# Patient Record
Sex: Female | Born: 1975 | Race: White | Hispanic: No | Marital: Married | Smoking: Former smoker
Health system: Southern US, Community
[De-identification: ages and names within clinical notes are randomized; demographics above are authoritative.]

## PROBLEM LIST (undated history)

## (undated) DIAGNOSIS — J45909 Unspecified asthma, uncomplicated: Secondary | ICD-10-CM

## (undated) DIAGNOSIS — Q250 Patent ductus arteriosus: Secondary | ICD-10-CM

## (undated) DIAGNOSIS — A048 Other specified bacterial intestinal infections: Secondary | ICD-10-CM

## (undated) HISTORY — PX: VEIN LIGATION: SHX2652

---
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
## 2003-12-16 DIAGNOSIS — A048 Other specified bacterial intestinal infections: Secondary | ICD-10-CM

## 2003-12-16 HISTORY — DX: Other specified bacterial intestinal infections: A04.8

## 2011-12-16 HISTORY — PX: ABDOMINAL HYSTERECTOMY: SHX81

## 2015-01-20 ENCOUNTER — Emergency Department (HOSPITAL_COMMUNITY)
Admission: EM | Admit: 2015-01-20 | Discharge: 2015-01-20 | Disposition: A | Discharge status: Home / Self Care | Payer: Self-pay | Attending: Emergency Medicine | Admitting: Emergency Medicine

## 2015-01-20 ENCOUNTER — Emergency Department (HOSPITAL_COMMUNITY): Payer: Self-pay

## 2015-01-20 ENCOUNTER — Encounter (HOSPITAL_COMMUNITY): Payer: Self-pay | Admitting: Emergency Medicine

## 2015-01-20 DIAGNOSIS — R300 Dysuria: Secondary | ICD-10-CM | POA: Insufficient documentation

## 2015-01-20 DIAGNOSIS — Z3202 Encounter for pregnancy test, result negative: Secondary | ICD-10-CM | POA: Insufficient documentation

## 2015-01-20 DIAGNOSIS — Z79899 Other long term (current) drug therapy: Secondary | ICD-10-CM | POA: Insufficient documentation

## 2015-01-20 DIAGNOSIS — Z8619 Personal history of other infectious and parasitic diseases: Secondary | ICD-10-CM | POA: Insufficient documentation

## 2015-01-20 DIAGNOSIS — J45909 Unspecified asthma, uncomplicated: Secondary | ICD-10-CM | POA: Insufficient documentation

## 2015-01-20 DIAGNOSIS — Z791 Long term (current) use of non-steroidal anti-inflammatories (NSAID): Secondary | ICD-10-CM | POA: Insufficient documentation

## 2015-01-20 DIAGNOSIS — R102 Pelvic and perineal pain: Secondary | ICD-10-CM

## 2015-01-20 HISTORY — DX: Patent ductus arteriosus: Q25.0

## 2015-01-20 HISTORY — DX: Other specified bacterial intestinal infections: A04.8

## 2015-01-20 HISTORY — DX: Unspecified asthma, uncomplicated: J45.909

## 2015-01-20 LAB — COMPREHENSIVE METABOLIC PANEL WITH GFR
ALT: 10 U/L (ref 0–35)
AST: 22 U/L (ref 0–37)
Albumin: 4.9 g/dL (ref 3.5–5.2)
Alkaline Phosphatase: 78 U/L (ref 39–117)
Anion gap: 9 (ref 5–15)
BUN: 14 mg/dL (ref 6–23)
CO2: 25 mmol/L (ref 19–32)
Calcium: 9.3 mg/dL (ref 8.4–10.5)
Chloride: 105 mmol/L (ref 96–112)
Creatinine, Ser: 0.58 mg/dL (ref 0.50–1.10)
GFR calc Af Amer: >90 mL/min (ref >90)
GFR calc non Af Amer: >90 mL/min (ref >90)
Glucose, Bld: 87 mg/dL (ref 70–99)
Potassium: 3.9 mmol/L (ref 3.5–5.1)
Sodium: 139 mmol/L (ref 135–145)
Total Bilirubin: 0.6 mg/dL (ref 0.3–1.2)
Total Protein: 7.9 g/dL (ref 6.0–8.3)

## 2015-01-20 LAB — URINALYSIS, ROUTINE W REFLEX MICROSCOPIC
Bilirubin Urine: NEGATIVE
Glucose, UA: NEGATIVE mg/dL
Hgb urine dipstick: NEGATIVE
Ketones, ur: NEGATIVE mg/dL
Leukocytes, UA: NEGATIVE
Nitrite: NEGATIVE
Protein, ur: NEGATIVE mg/dL
Specific Gravity, Urine: 1.007 (ref 1.005–1.030)
Urobilinogen, UA: 0.2 mg/dL (ref 0.0–1.0)
pH: 7 (ref 5.0–8.0)

## 2015-01-20 LAB — CBC WITH DIFFERENTIAL/PLATELET
Basophils Absolute: 0.1 10*3/uL (ref 0.0–0.1)
Basophils Relative: 1 % (ref 0–1)
Eosinophils Absolute: 0.4 10*3/uL (ref 0.0–0.7)
Eosinophils Relative: 5 % (ref 0–5)
HCT: 43.2 % (ref 36.0–46.0)
Hemoglobin: 14.5 g/dL (ref 12.0–15.0)
Lymphocytes Relative: 42 % (ref 12–46)
Lymphs Abs: 3.1 10*3/uL (ref 0.7–4.0)
MCH: 31.1 pg (ref 26.0–34.0)
MCHC: 33.6 g/dL (ref 30.0–36.0)
MCV: 92.7 fL (ref 78.0–100.0)
Monocytes Absolute: 0.6 10*3/uL (ref 0.1–1.0)
Monocytes Relative: 9 % (ref 3–12)
Neutro Abs: 3.1 10*3/uL (ref 1.7–7.7)
Neutrophils Relative %: 43 % (ref 43–77)
Platelets: 255 10*3/uL (ref 150–400)
RBC: 4.66 MIL/uL (ref 3.87–5.11)
RDW: 13.2 % (ref 11.5–15.5)
WBC: 7.3 10*3/uL (ref 4.0–10.5)

## 2015-01-20 LAB — WET PREP, GENITAL
Clue Cells Wet Prep HPF POC: NONE SEEN
Trich, Wet Prep: NONE SEEN
WBC, Wet Prep HPF POC: NONE SEEN
Yeast Wet Prep HPF POC: NONE SEEN

## 2015-01-20 LAB — LIPASE, BLOOD: Lipase: 27 U/L (ref 11–59)

## 2015-01-20 MED ORDER — ONDANSETRON HCL 4 MG/2ML IJ SOLN
4.0000 mg | Freq: Once | INTRAMUSCULAR | Status: AC
  Administered 2015-01-20: 4 mg via INTRAVENOUS
  Filled 2015-01-20: qty 2

## 2015-01-20 MED ORDER — NAPROXEN 500 MG PO TABS: 500.0000 mg | ORAL_TABLET | Freq: Two times a day (BID) | ORAL | Status: DC

## 2015-01-20 MED ORDER — HYDROMORPHONE HCL 1 MG/ML IJ SOLN
1.0000 mg | Freq: Once | INTRAMUSCULAR | Status: AC
  Administered 2015-01-20: 1 mg via INTRAVENOUS
  Filled 2015-01-20: qty 1

## 2015-01-20 MED ORDER — SODIUM CHLORIDE 0.9 % IV BOLUS (SEPSIS): 1000.0000 mL | Freq: Once | INTRAVENOUS | Status: DC

## 2015-01-20 MED ORDER — KETOROLAC TROMETHAMINE 30 MG/ML IJ SOLN: 30.0000 mg | Freq: Once | INTRAMUSCULAR | Status: DC

## 2015-01-20 MED ORDER — PHENAZOPYRIDINE HCL 200 MG PO TABS: 200.0000 mg | ORAL_TABLET | Freq: Three times a day (TID) | ORAL | Status: DC

## 2015-01-20 MED ORDER — SODIUM CHLORIDE 0.9 % IV SOLN
1000.0000 mL | Freq: Once | INTRAVENOUS | Status: AC
  Administered 2015-01-20: 1000 mL via INTRAVENOUS

## 2015-01-20 NOTE — ED Notes (Signed)
Patient has a history of having bladder problems. Patient is complaining of abdominal pain that started today at work and has continuously became worse.

## 2015-01-20 NOTE — ED Provider Notes (Signed)
CSN: [id number]     Arrival date & time 01/20/15  2015 History   First MD Initiated Contact with Patient 01/20/15 2043     Chief Complaint  Patient presents with  . Abdominal Pain     (Consider location/radiation/quality/duration/timing/severity/associated sxs/prior Treatment) HPI Pt is a 39yo female with hx of "bladder problems" and endometriosis, presenting to ED with c/o gradually worsening suprapubic and RLQ abdominal pain since yesterday with associated dysuria.  Abdominal pain is constant, aching, cramping and throbbing, 10/10 at worst, 6/10 at this time.  Pt reports urinary frequency with pressure, and dysuria. States she was having multiple recurrent UTIs and had to have several bladder procedures including stretching of her urethra due to scar tissue after a foley was placed while pt was pregnant at 39yrs old. Pt is followed by urology at [facility] in [facility]. States she has not bladder issues for 2 years but thinks she is needs to have her urethra stretched again.  Denies taking any pain medication as she states she does not like taking medication including antibiotics as she always gets a yeast infection but does state she was on macrobid for previous UTIs which seemed to help.  Pt also states she has had to have  dilaudid to help with her pelvic pain in the past. No pain medication PTA.  Past Medical History  Diagnosis Date  . PDA (patent ductus arteriosus) 1985    left ventrical repair  . Asthma   . H. pylori infection 2005   Past Surgical History  Procedure Laterality Date  . Abdominal hysterectomy  2013    Right ovary still there but every thing else was taken out  . Vein ligation     No family history on file. History  Substance Use Topics  . Smoking status: Former Smoker    Quit date: 12/20/2014  . Smokeless tobacco: Never Used  . Alcohol Use: No   OB History    No data available     Review of Systems  Constitutional: Negative for fever and chills.    Gastrointestinal: Positive for nausea and abdominal pain. Negative for vomiting, diarrhea and constipation.  Genitourinary: Positive for dysuria, urgency, frequency, flank pain and pelvic pain ( right side and suprapubic). Negative for hematuria, vaginal bleeding, vaginal discharge and vaginal pain.  Musculoskeletal: Negative for myalgias and back pain.  All other systems reviewed and are negative.     Allergies  Review of patient's allergies indicates no known allergies.  Home Medications   Prior to Admission medications   Medication Sig Start Date End Date Taking? Authorizing Provider  naproxen (NAPROSYN) 500 MG tablet Take 1 tablet (500 mg total) by mouth 2 (two) times daily. 01/20/15   Junius Finner, PA-C  phenazopyridine (PYRIDIUM) 200 MG tablet Take 1 tablet (200 mg total) by mouth 3 (three) times daily. 01/20/15   Junius Finner, PA-C   BP 123/77 mmHg  Pulse 79  Temp(Src) 98.3 F (36.8 C) (Oral)  Resp 18  Ht  (1.702 m)  Wt 137 lb (62.143 kg)  BMI 21.45 kg/m2  SpO2 98% Physical Exam  Constitutional: She appears well-developed and well-nourished. No distress.  Pt lying in exam bed, NAD. Non-toxic appearing.  HENT:  Head: Normocephalic and atraumatic.  Eyes: Conjunctivae are normal. No scleral icterus.  Neck: Normal range of motion.  Cardiovascular: Normal rate, regular rhythm and normal heart sounds.   Pulmonary/Chest: Effort normal and breath sounds normal. No respiratory distress. She has no wheezes. She has no  rales. She exhibits no tenderness.  Abdominal: Soft. Bowel sounds are normal. She exhibits no distension and no mass. There is tenderness in the right lower quadrant and suprapubic area. There is no rebound, no guarding and no CVA tenderness.    Soft, non-distended. Tenderness to RLQ and suprapubic region w/o rebound or guarding. No masses palpated. No CVAT  Genitourinary:  Chaperoned exam. Normal external genitalia. Vaginal canal-small amount of physiologic  discharge. No vaginal bleeding. No CMT. Right side adnexal tenderness w/o mass palpated.  No left adnexal tenderness or mass. Tenderness over bladder w/o palpable mass.  Musculoskeletal: Normal range of motion.  Neurological: She is alert.  Skin: Skin is warm and dry. She is not diaphoretic.  Nursing note and vitals reviewed.   ED Course  Procedures (including critical care time) Labs Review Labs Reviewed  WET PREP, GENITAL  URINALYSIS, ROUTINE W REFLEX MICROSCOPIC  CBC WITH DIFFERENTIAL/PLATELET  COMPREHENSIVE METABOLIC PANEL  LIPASE, BLOOD  RPR  POC URINE PREG, ED  GC/CHLAMYDIA PROBE AMP ([place])    Imaging Review US Transvaginal Non-ob  01/20/2015   CLINICAL DATA:  Right adnexal pain.  EXAM: TRANSABDOMINAL AND TRANSVAGINAL ULTRASOUND OF PELVIS  TECHNIQUE: Both transabdominal and transvaginal ultrasound examinations of the pelvis were performed. Transabdominal technique was performed for global imaging of the pelvis including uterus, ovaries, adnexal regions, and pelvic cul-de-sac. It was necessary to proceed with endovaginal exam following the transabdominal exam to visualize the ovaries.  COMPARISON:  None  FINDINGS: Uterus  Surgically absent.  Endometrium  N/A  Right ovary  Measurements: 3.9 x 2.1 x 2.1 cm. Normal appearance/no adnexal mass.  Left ovary  Surgically absent.  No adnexal mass.  Other findings  No free fluid.  The bladder appears normal.  IMPRESSION: Status post hysterectomy and left oophorectomy.  No adnexal mass.  Normal right ovary.   Electronically Signed   By: Loralie Champagne M.D.   On: 01/20/2015 22:50   US Pelvis Complete  01/20/2015   CLINICAL DATA:  Right adnexal pain.  EXAM: TRANSABDOMINAL AND TRANSVAGINAL ULTRASOUND OF PELVIS  TECHNIQUE: Both transabdominal and transvaginal ultrasound examinations of the pelvis were performed. Transabdominal technique was performed for global imaging of the pelvis including uterus, ovaries, adnexal regions, and pelvic  cul-de-sac. It was necessary to proceed with endovaginal exam following the transabdominal exam to visualize the ovaries.  COMPARISON:  None  FINDINGS: Uterus  Surgically absent.  Endometrium  N/A  Right ovary  Measurements: 3.9 x 2.1 x 2.1 cm. Normal appearance/no adnexal mass.  Left ovary  Surgically absent.  No adnexal mass.  Other findings  No free fluid.  The bladder appears normal.  IMPRESSION: Status post hysterectomy and left oophorectomy.  No adnexal mass.  Normal right ovary.   Electronically Signed   By: Loralie Champagne M.D.   On: 01/20/2015 22:50     EKG Interpretation None      MDM   Final diagnoses:  Pelvic pain in female  Dysuria    Pt is a 39yo female with reports of recurrent UTIs due to "bladder issues" reports hx of having her urethra stretched, followed by [facility] in [facility]. Pt appears uncomfortable but non-toxic. Pt is afebrile. Tenderness in RLQ and suprapubic region. Right adnexal tenderness w/o mass.  Labs: WNL. No evidence of UTI or other underlying infection.  Pelvic U/S: normal exam.  Not concerned for TOA, ovarian torsion, or ectopic pregnancy.  Doubt appendicitis.  Pain improved after  IV dilaudid and IV fluids.  Re-exam of abdomen: soft, non-distended, non-tender.  Not concerned for surgical abdomen. Pt hemodynamically stable, will discharge home with symptomatic tx with pyridium and naproxen. Advised to f/u with urology for recheck of symptoms. Return precautions provided. Pt verbalized understanding and agreement with tx plan.     Junius Finnerrin O'Malley, PA-C 01/20/15 2358  Gwyneth SproutWhitney Plunkett, MD 01/21/15 [phone]

## 2015-01-22 LAB — GC/CHLAMYDIA PROBE AMP (~~LOC~~) NOT AT ARMC
Chlamydia: NEGATIVE
Neisseria Gonorrhea: NEGATIVE

## 2015-01-22 LAB — RPR: RPR Ser Ql: NONREACTIVE

## 2015-10-19 ENCOUNTER — Encounter (HOSPITAL_COMMUNITY): Payer: Self-pay | Admitting: Emergency Medicine

## 2015-10-19 ENCOUNTER — Emergency Department (HOSPITAL_COMMUNITY)
Admission: EM | Admit: 2015-10-19 | Discharge: 2015-10-19 | Disposition: A | Discharge status: Home / Self Care | Payer: Self-pay | Attending: Emergency Medicine | Admitting: Emergency Medicine

## 2015-10-19 DIAGNOSIS — Q250 Patent ductus arteriosus: Secondary | ICD-10-CM | POA: Insufficient documentation

## 2015-10-19 DIAGNOSIS — J45909 Unspecified asthma, uncomplicated: Secondary | ICD-10-CM | POA: Insufficient documentation

## 2015-10-19 DIAGNOSIS — H8111 Benign paroxysmal vertigo, right ear: Secondary | ICD-10-CM | POA: Insufficient documentation

## 2015-10-19 DIAGNOSIS — Z87891 Personal history of nicotine dependence: Secondary | ICD-10-CM | POA: Insufficient documentation

## 2015-10-19 DIAGNOSIS — Z79899 Other long term (current) drug therapy: Secondary | ICD-10-CM | POA: Insufficient documentation

## 2015-10-19 DIAGNOSIS — G43809 Other migraine, not intractable, without status migrainosus: Secondary | ICD-10-CM | POA: Insufficient documentation

## 2015-10-19 DIAGNOSIS — Z8619 Personal history of other infectious and parasitic diseases: Secondary | ICD-10-CM | POA: Insufficient documentation

## 2015-10-19 LAB — BASIC METABOLIC PANEL
Anion gap: 7 (ref 5–15)
BUN: 16 mg/dL (ref 6–20)
CO2: 27 mmol/L (ref 22–32)
Calcium: 9.4 mg/dL (ref 8.9–10.3)
Chloride: 104 mmol/L (ref 101–111)
Creatinine, Ser: 0.68 mg/dL (ref 0.44–1.00)
GFR calc Af Amer: >60 mL/min (ref >60)
GFR calc non Af Amer: >60 mL/min (ref >60)
Glucose, Bld: 98 mg/dL (ref 65–99)
Potassium: 3.9 mmol/L (ref 3.5–5.1)
Sodium: 138 mmol/L (ref 135–145)

## 2015-10-19 LAB — URINALYSIS, ROUTINE W REFLEX MICROSCOPIC
Bilirubin Urine: NEGATIVE
Glucose, UA: NEGATIVE mg/dL
Hgb urine dipstick: NEGATIVE
Ketones, ur: NEGATIVE mg/dL
Leukocytes, UA: NEGATIVE
Nitrite: NEGATIVE
Protein, ur: NEGATIVE mg/dL
Specific Gravity, Urine: 1.014 (ref 1.005–1.030)
Urobilinogen, UA: 1 mg/dL (ref 0.0–1.0)
pH: 7 (ref 5.0–8.0)

## 2015-10-19 LAB — CBC
HCT: 38.9 % (ref 36.0–46.0)
Hemoglobin: 12.9 g/dL (ref 12.0–15.0)
MCH: 30.7 pg (ref 26.0–34.0)
MCHC: 33.2 g/dL (ref 30.0–36.0)
MCV: 92.6 fL (ref 78.0–100.0)
Platelets: 242 10*3/uL (ref 150–400)
RBC: 4.2 MIL/uL (ref 3.87–5.11)
RDW: 13.1 % (ref 11.5–15.5)
WBC: 7.1 10*3/uL (ref 4.0–10.5)

## 2015-10-19 MED ORDER — SODIUM CHLORIDE 0.9 % IV BOLUS (SEPSIS)
1000.0000 mL | Freq: Once | INTRAVENOUS | Status: AC
  Administered 2015-10-19: 1000 mL via INTRAVENOUS

## 2015-10-19 MED ORDER — MECLIZINE HCL 25 MG PO TABS: 25.0000 mg | ORAL_TABLET | Freq: Three times a day (TID) | ORAL | Status: DC | PRN

## 2015-10-19 MED ORDER — DEXAMETHASONE SODIUM PHOSPHATE 10 MG/ML IJ SOLN
10.0000 mg | Freq: Once | INTRAMUSCULAR | Status: AC
  Administered 2015-10-19: 10 mg via INTRAVENOUS
  Filled 2015-10-19: qty 1

## 2015-10-19 MED ORDER — METOCLOPRAMIDE HCL 5 MG/ML IJ SOLN
10.0000 mg | Freq: Once | INTRAMUSCULAR | Status: AC
  Administered 2015-10-19: 10 mg via INTRAVENOUS
  Filled 2015-10-19: qty 2

## 2015-10-19 MED ORDER — DIPHENHYDRAMINE HCL 50 MG/ML IJ SOLN
25.0000 mg | Freq: Once | INTRAMUSCULAR | Status: AC
  Administered 2015-10-19: 25 mg via INTRAVENOUS
  Filled 2015-10-19: qty 1

## 2015-10-19 NOTE — ED Provider Notes (Signed)
CSN: [id number]     Arrival date & time 10/19/15  1712 History   First MD Initiated Contact with Patient 10/19/15 1804     Chief Complaint  Patient presents with  . low iron      (Consider location/radiation/quality/duration/timing/severity/associated sxs/prior Treatment) Patient is a 39 y.o. female presenting with dizziness. The history is provided by the patient.  Dizziness Quality:  Lightheadedness and head spinning Severity:  Moderate Onset quality:  Gradual Duration:  2 weeks Timing:  Constant Progression:  Waxing and waning Chronicity:  New Context: not with loss of consciousness   Relieved by:  Nothing Worsened by:  Nothing Ineffective treatments:  None tried Associated symptoms: headaches (dull back of head)   Associated symptoms: no chest pain, no diarrhea, no shortness of breath, no syncope and no vomiting   Risk factors: no hx of vertigo and no new medications     Past Medical History  Diagnosis Date  . PDA (patent ductus arteriosus) 1985    left ventrical repair  . Asthma   . H. pylori infection 2005   Past Surgical History  Procedure Laterality Date  . Abdominal hysterectomy  2013    Right ovary still there but every thing else was taken out  . Vein ligation     No family history on file. Social History  Substance Use Topics  . Smoking status: Former Smoker    Quit date: 12/20/2014  . Smokeless tobacco: Never Used  . Alcohol Use: No   OB History    No data available     Review of Systems  Respiratory: Negative for shortness of breath.   Cardiovascular: Negative for chest pain and syncope.  Gastrointestinal: Negative for vomiting and diarrhea.  Neurological: Positive for dizziness and headaches (dull back of head).  All other systems reviewed and are negative.     Allergies  Review of patient's allergies indicates no known allergies.  Home Medications   Prior to Admission medications   Medication Sig Start Date End Date Taking?  Authorizing Provider  Aspirin-Acetaminophen-Caffeine (GOODY HEADACHE PO) Take 1 Dose by mouth daily as needed (headache).   Yes Historical Provider, MD  Cyanocobalamin (B-12 PO) Take 1 tablet by mouth every evening.   Yes Historical Provider, MD  IRON PO Take 1 tablet by mouth every evening.   Yes Historical Provider, MD  Misc Natural Products (OSTEO BI-FLEX ADV DOUBLE ST PO) Take 2 tablets by mouth every evening.   Yes Historical Provider, MD  meclizine (ANTIVERT) 25 MG tablet Take 1 tablet (25 mg total) by mouth 3 (three) times daily as needed for dizziness. 10/19/15   Lyndal Pulley, MD  naproxen (NAPROSYN) 500 MG tablet Take 1 tablet (500 mg total) by mouth 2 (two) times daily. Patient not taking: Reported on 10/19/2015 01/20/15   Junius Finner, PA-C  phenazopyridine (PYRIDIUM) 200 MG tablet Take 1 tablet (200 mg total) by mouth 3 (three) times daily. Patient not taking: Reported on 10/19/2015 01/20/15   Junius Finner, PA-C   BP 114/70 mmHg  Pulse 78  Temp(Src) 97.9 F (36.6 C) (Oral)  Resp 18  SpO2 97% Physical Exam  Constitutional: She is oriented to person, place, and time. She appears well-developed and well-nourished. No distress.  HENT:  Head: Normocephalic.  Eyes: Conjunctivae are normal. Pupils are equal, round, and reactive to light. Right eye exhibits normal extraocular motion and no nystagmus. Left eye exhibits normal extraocular motion and no nystagmus.  Neck: Neck supple. No tracheal deviation present.  Cardiovascular: Normal  rate and regular rhythm.   Pulmonary/Chest: Effort normal. No respiratory distress.  Abdominal: Soft. She exhibits no distension.  Neurological: She is alert and oriented to person, place, and time. No cranial nerve deficit or sensory deficit. GCS eye subscore is 4. GCS verbal subscore is 5. GCS motor subscore is 6.  Negative HINNTS exam. Right head turn exacerbates vertigo symptoms  Skin: Skin is warm and dry.  Psychiatric: She has a normal mood and affect.     ED Course  Procedures (including critical care time) Labs Review Labs Reviewed  URINALYSIS, ROUTINE W REFLEX MICROSCOPIC (NOT [facility]) - Abnormal; Notable for the following:    APPearance HAZY (*)    All other components within normal limits  BASIC METABOLIC PANEL  CBC    Imaging Review No results found. I have personally reviewed and evaluated these images and lab results as part of my medical decision-making.   EKG Interpretation None      MDM   Final diagnoses:  BPPV (benign paroxysmal positional vertigo), right  Other migraine without status migrainosus, not intractable    39 y.o. female presents with headache and intermittent dizziness over last 5 days. Afebrile, HDS, no meningeal signs. Vertigo is reproducible. Pt has h/o remote migraines. Not on any controller medications. Treated for complex migraine with good relief of headache symptoms. No neuro deficits or other indication for imaging currently, likely BPPV related to viral syndrome vs otolith. Provided meclizine for home. Plan to follow up with PCP as needed and return precautions discussed for worsening or new concerning symptoms.     Lyndal Pulleyaniel Bassheva Flury, MD 10/20/15 [phone]

## 2015-10-19 NOTE — ED Notes (Signed)
Per pt, states recently diagnosed with low iron-has been taking supplement-increased fatigue and dizziness

## 2015-10-19 NOTE — Discharge Instructions (Signed)
Benign Positional Vertigo Vertigo is the feeling that you or your surroundings are moving when they are not. Benign positional vertigo is the most common form of vertigo. The cause of this condition is not serious (is benign). This condition is triggered by certain movements and positions (is positional). This condition can be dangerous if it occurs while you are doing something that could endanger you or others, such as driving.  CAUSES In many cases, the cause of this condition is not known. It may be caused by a disturbance in an area of the inner ear that helps your brain to sense movement and balance. This disturbance can be caused by a viral infection (labyrinthitis), head injury, or repetitive motion. RISK FACTORS This condition is more likely to develop in: 1. Women. 2. People who are 50 years of age or older. SYMPTOMS Symptoms of this condition usually happen when you move your head or your eyes in different directions. Symptoms may start suddenly, and they usually last for less than a minute. Symptoms may include:  Loss of balance and falling.  Feeling like you are spinning or moving.  Feeling like your surroundings are spinning or moving.  Nausea and vomiting.  Blurred vision.  Dizziness.  Involuntary eye movement (nystagmus). Symptoms can be mild and cause only slight annoyance, or they can be severe and interfere with daily life. Episodes of benign positional vertigo may return (recur) over time, and they may be triggered by certain movements. Symptoms may improve over time. DIAGNOSIS This condition is usually diagnosed by medical history and a physical exam of the head, neck, and ears. You may be referred to a health care provider who specializes in ear, nose, and throat (ENT) problems (otolaryngologist) or a provider who specializes in disorders of the nervous system (neurologist). You may have additional testing, including:  MRI.  A CT scan.  Eye movement tests. Your  health care provider may ask you to change positions quickly while he or she watches you for symptoms of benign positional vertigo, such as nystagmus. Eye movement may be tested with an electronystagmogram (ENG), caloric stimulation, the Dix-Hallpike test, or the roll test.  An electroencephalogram (EEG). This records electrical activity in your brain.  Hearing tests. TREATMENT Usually, your health care provider will treat this by moving your head in specific positions to adjust your inner ear back to normal. Surgery may be needed in severe cases, but this is rare. In some cases, benign positional vertigo may resolve on its own in 2-4 weeks. HOME CARE INSTRUCTIONS Safety  Move slowly.Avoid sudden body or head movements.  Avoid driving.  Avoid operating heavy machinery.  Avoid doing any tasks that would be dangerous to you or others if a vertigo episode would occur.  If you have trouble walking or keeping your balance, try using a cane for stability. If you feel dizzy or unstable, sit down right away.  Return to your normal activities as told by your health care provider. Ask your health care provider what activities are safe for you. General Instructions  Take over-the-counter and prescription medicines only as told by your health care provider.  Avoid certain positions or movements as told by your health care provider.  Drink enough fluid to keep your urine clear or pale yellow.  Keep all follow-up visits as told by your health care provider. This is important. SEEK MEDICAL CARE IF:  You have a fever.  Your condition gets worse or you develop new symptoms.  Your family or friends   notice any behavioral changes.  Your nausea or vomiting gets worse.  You have numbness or a "pins and needles" sensation. SEEK IMMEDIATE MEDICAL CARE IF:  You have difficulty speaking or moving.  You are always dizzy.  You faint.  You develop severe headaches.  You have weakness in your  legs or arms.  You have changes in your hearing or vision.  You develop a stiff neck.  You develop sensitivity to light.   This information is not intended to replace advice given to you by your health care provider. Make sure you discuss any questions you have with your health care provider.   Document Released: 09/08/2006 Document Revised: 08/22/2015 Document Reviewed: 03/26/2015 Elsevier Interactive Patient Education 2016 Elsevier Inc. Epley Maneuver Self-Care WHAT IS THE EPLEY MANEUVER? The Epley maneuver is an exercise you can do to relieve symptoms of benign paroxysmal positional vertigo (BPPV). This condition is often just referred to as vertigo. BPPV is caused by the movement of tiny crystals (canaliths) inside your inner ear. The accumulation and movement of canaliths in your inner ear causes a sudden spinning sensation (vertigo) when you move your head to certain positions. Vertigo usually lasts about 30 seconds. BPPV usually occurs in just one ear. If you get vertigo when you lie on your left side, you probably have BPPV in your left ear. Your health care provider can tell you which ear is involved.  BPPV may be caused by a head injury. Many people older than 50 get BPPV for unknown reasons. If you have been diagnosed with BPPV, your health care provider may teach you how to do this maneuver. BPPV is not life threatening (benign) and usually goes away in time.  WHEN SHOULD I PERFORM THE EPLEY MANEUVER? You can do this maneuver at home whenever you have symptoms of vertigo. You may do the Epley maneuver up to 3 times a day until your symptoms of vertigo go away. HOW SHOULD I DO THE EPLEY MANEUVER? 3. Sit on the edge of a bed or table with your back straight. Your legs should be extended or hanging over the edge of the bed or table.  4. Turn your head halfway toward the affected ear.  5. Lie backward quickly with your head turned until you are lying flat on your back. You may want to  position a pillow under your shoulders.  6. Hold this position for 30 seconds. You may experience an attack of vertigo. This is normal. Hold this position until the vertigo stops. 7. Then turn your head to the opposite direction until your unaffected ear is facing the floor.  8. Hold this position for 30 seconds. You may experience an attack of vertigo. This is normal. Hold this position until the vertigo stops. 9. Now turn your whole body to the same side as your head. Hold for another 30 seconds.  10. You can then sit back up. ARE THERE RISKS TO THIS MANEUVER? In some cases, you may have other symptoms (such as changes in your vision, weakness, or numbness). If you have these symptoms, stop doing the maneuver and call your health care provider. Even if doing these maneuvers relieves your vertigo, you may still have dizziness. Dizziness is the sensation of light-headedness but without the sensation of movement. Even though the Epley maneuver may relieve your vertigo, it is possible that your symptoms will return within 5 years. WHAT SHOULD I DO AFTER THIS MANEUVER? After doing the Epley maneuver, you can return to your normal   activities. Ask your doctor if there is anything you should do at home to prevent vertigo. This may include:  Sleeping with two or more pillows to keep your head elevated.  Not sleeping on the side of your affected ear.  Getting up slowly from bed.  Avoiding sudden movements during the day.  Avoiding extreme head movement, like looking up or bending over.  Wearing a cervical collar to prevent sudden head movements. WHAT SHOULD I DO IF MY SYMPTOMS GET WORSE? Call your health care provider if your vertigo gets worse. Call your provider right way if you have other symptoms, including:   Nausea.  Vomiting.  Headache.  Weakness.  Numbness.  Vision changes.   This information is not intended to replace advice given to you by your health care provider. Make  sure you discuss any questions you have with your health care provider.   Document Released: 12/06/2013 Document Reviewed: 12/06/2013 Elsevier Interactive Patient Education 2016 Elsevier Inc.  

## 2015-10-19 NOTE — ED Notes (Signed)
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  Dizzy upon movement noted.  Malaise  Negative for stroke symptoms

## 2020-02-29 ENCOUNTER — Other Ambulatory Visit: Payer: Self-pay | Admitting: Podiatry

## 2020-02-29 ENCOUNTER — Other Ambulatory Visit: Payer: Self-pay

## 2020-02-29 ENCOUNTER — Ambulatory Visit (INDEPENDENT_AMBULATORY_CARE_PROVIDER_SITE_OTHER): Payer: BC Managed Care – PPO

## 2020-02-29 ENCOUNTER — Ambulatory Visit: Payer: BC Managed Care – PPO | Admitting: Podiatry

## 2020-02-29 DIAGNOSIS — M659 Synovitis and tenosynovitis, unspecified: Secondary | ICD-10-CM

## 2020-02-29 DIAGNOSIS — M2141 Flat foot [pes planus] (acquired), right foot: Secondary | ICD-10-CM

## 2020-02-29 DIAGNOSIS — M2142 Flat foot [pes planus] (acquired), left foot: Secondary | ICD-10-CM | POA: Diagnosis not present

## 2020-02-29 DIAGNOSIS — M722 Plantar fascial fibromatosis: Secondary | ICD-10-CM | POA: Diagnosis not present

## 2020-03-01 ENCOUNTER — Ambulatory Visit (INDEPENDENT_AMBULATORY_CARE_PROVIDER_SITE_OTHER): Payer: BC Managed Care – PPO | Admitting: Orthotics

## 2020-03-01 DIAGNOSIS — M65172 Other infective (teno)synovitis, left ankle and foot: Secondary | ICD-10-CM | POA: Diagnosis not present

## 2020-03-01 DIAGNOSIS — M659 Synovitis and tenosynovitis, unspecified: Secondary | ICD-10-CM

## 2020-03-01 DIAGNOSIS — M65171 Other infective (teno)synovitis, right ankle and foot: Secondary | ICD-10-CM

## 2020-03-01 DIAGNOSIS — M722 Plantar fascial fibromatosis: Secondary | ICD-10-CM

## 2020-03-02 NOTE — Progress Notes (Signed)
Patient came into today to be cast for Custom Foot Orthotics. Upon recommendation of Dr. Logan Bores Patient presents with synovitis rt ankle Goals are rear foot stability Plan vendor [place]

## 2020-03-04 NOTE — Progress Notes (Signed)
   Subjective: 44 y.o. female presenting today as a new patient with a chief complaint of constant aching pain of the bilateral ankle joints that began about 8 months ago. She states the pain is worse in the morning when she first gets out of bed. She has been using ankle braces and exercising the ankles for treatment. Patient is here for further evaluation and treatment.   Past Medical History:  Diagnosis Date  . Asthma   . H. pylori infection 2005  . PDA (patent ductus arteriosus) 1985   left ventrical repair     Objective: Physical Exam General: The patient is alert and oriented x3 in no acute distress.  Dermatology: Skin is warm, dry and supple bilateral lower extremities. Negative for open lesions or macerations bilateral.   Vascular: Dorsalis Pedis and Posterior Tibial pulses palpable bilateral.  Capillary fill time is immediate to all digits.  Neurological: Epicritic and protective threshold intact bilateral.   Musculoskeletal: Tenderness to palpation to the plantar aspect of the bilateral heels along the plantar fascia as well as to the anterior, lateral and medial aspects of the bilateral ankle joints. All other joints range of motion within normal limits bilateral. Strength 5/5 in all groups bilateral.   Radiographic exam: Normal osseous mineralization. Joint spaces preserved. No fracture/dislocation/boney destruction. No other soft tissue abnormalities or radiopaque foreign bodies.   Assessment: 1. plantar fasciitis bilateral feet 2. Ankle synovitis bilateral   Plan of Care:  1. Patient evaluated. Xrays reviewed.   2. Injection of 0.5cc Celestone soluspan injected into the bilateral heels.  3. Continue taking Meloxicam 15 mg daily.  4. Plantar fascial braces dispensed.  5. Appointment with Pedorthist for custom molded orthotics.  6. Continue using night splints at home.  7. Return to clinic as needed.   Massage therapist.    Felecia Shelling, DPM [facility]  Dr. Felecia Shelling, DPM    [address] [place], [place] [address]                Office [phone]  Fax [phone]

## 2020-03-21 ENCOUNTER — Other Ambulatory Visit (HOSPITAL_COMMUNITY): Payer: Self-pay | Admitting: Orthopedic Surgery

## 2020-03-21 ENCOUNTER — Ambulatory Visit
Admission: RE | Admit: 2020-03-21 | Discharge: 2020-03-21 | Disposition: A | Discharge status: Home / Self Care | Payer: BC Managed Care – PPO | Source: Ambulatory Visit | Attending: Orthopedic Surgery | Admitting: Orthopedic Surgery

## 2020-03-21 ENCOUNTER — Other Ambulatory Visit: Payer: Self-pay | Admitting: Orthopedic Surgery

## 2020-03-21 ENCOUNTER — Other Ambulatory Visit: Payer: Self-pay

## 2020-03-21 DIAGNOSIS — M542 Cervicalgia: Secondary | ICD-10-CM

## 2020-03-21 MED ORDER — GADOBUTROL 1 MMOL/ML IV SOLN
6.0000 mL | Freq: Once | INTRAVENOUS | Status: AC | PRN
  Administered 2020-03-21: 20:00:00 6 mL via INTRAVENOUS

## 2020-03-29 ENCOUNTER — Ambulatory Visit: Payer: BC Managed Care – PPO | Admitting: Orthotics

## 2020-03-29 ENCOUNTER — Other Ambulatory Visit: Payer: Self-pay

## 2020-03-29 DIAGNOSIS — M659 Synovitis and tenosynovitis, unspecified: Secondary | ICD-10-CM

## 2020-03-29 DIAGNOSIS — M722 Plantar fascial fibromatosis: Secondary | ICD-10-CM

## 2020-03-29 NOTE — Progress Notes (Signed)
Patient came in today to pick up custom made foot orthotics.  The goals were accomplished and the patient reported no dissatisfaction with said orthotics.  Patient was advised of breakin period and how to report any issues. 

## 2021-11-11 ENCOUNTER — Ambulatory Visit: Payer: Self-pay | Admitting: Orthopedic Surgery

## 2021-11-11 DIAGNOSIS — Z01818 Encounter for other preprocedural examination: Secondary | ICD-10-CM

## 2021-11-22 ENCOUNTER — Ambulatory Visit: Payer: Self-pay | Admitting: Orthopedic Surgery

## 2021-11-22 NOTE — H&P (Signed)
Subjective:   ACDF C3-5 12/05/21 [facility]  Past Medical History:  Diagnosis Date   Asthma    H. pylori infection 2005   PDA (patent ductus arteriosus) 1985   left ventrical repair    Past Surgical History:  Procedure Laterality Date   ABDOMINAL HYSTERECTOMY  2013   Right ovary still there but every thing else was taken out   VEIN LIGATION      Current Outpatient Medications  Medication Sig Dispense Refill Last Dose   Aspirin-Acetaminophen-Caffeine (GOODY HEADACHE PO) Take 1 Dose by mouth daily as needed (headache).      clonazePAM (KLONOPIN) 0.5 MG tablet Take 0.5-1 mg by mouth at bedtime as needed.      Cyanocobalamin (B-12 PO) Take 1 tablet by mouth every evening.      dicyclomine (BENTYL) 10 MG capsule SMARTSIG:1 Capsule(s) By Mouth 5 Times Daily PRN      IRON PO Take 1 tablet by mouth every evening.      meloxicam (MOBIC) 15 MG tablet Take 15 mg by mouth daily.      Misc Natural Products (OSTEO BI-FLEX ADV DOUBLE ST PO) Take 2 tablets by mouth every evening.      omeprazole (PRILOSEC) 20 MG capsule Take by mouth.      No current facility-administered medications for this visit.   No Known Allergies  Social History   Tobacco Use   Smoking status: Former    Types: Cigarettes    Quit date: 12/20/2014    Years since quitting: 6.9   Smokeless tobacco: Never  Substance Use Topics   Alcohol use: No    No family history on file.  Review of Systems Pertinent items are noted in HPI.  Objective:   Vitals: Ht: 5 ft 7 in 11/22/2021 09:53 am BP: 120/90 11/22/2021 09:55 am Pulse: 75 bpm 11/22/2021 09:54 am  Clinical exam: Aluel is a pleasant individual, who appears younger than their stated age. She is alert and orientated 3. No shortness of breath, chest pain.  Heart: RRR, no rubs, murmers, or gallops  Lungs: CTAB  Abdomen is soft and non-tender, negative loss of bowel and bladder control, no rebound tenderness.  Negative: skin lesions abrasions  contusions Peripheral pulses: 2+ dorsalis pedis/posterior tibialis pulses. Compartment soft and nontender. Gait pattern:abnormal gait pattern due to horrific low back pain. Assistive devices: None  Neuro: 5/5 motor strength in the upper extremity bilaterally, negative Spurling test, no clonus, negative Babinski test. Brisk 2+ deep tendon reflexes symmetrically in the upper extremity bilaterally. Positive numbness and dysesthesias primarily in the C5 dermatome on the left side.  Cervical spine: Significant neck pain with palpation and range of motion radiating into the left trap and left upper extremity. No shoulder pain with isolated joint range of motion or palpation. Cervical x-rays taken today in the office (AP/lateral) were reviewed:Demonstrate degenerative cervical disc disease C3-4 and C4-5 with loss of normal cervical lordosis. Most prominent disease at the C4-5 level.  Cervical spine x-rays were taken at [facility] dated 10/23/2021. The images as well as the report were reviewed by me as well as Dr. Shon Baton. There is a left-sided disc protrusion at C4-5 causing moderate left neural foraminal narrowing and impinging on the left C5 nerve roots.  Assessment:   Erica Clark is a pleasant 45 year old female presents today for preop H&P. She is scheduled for ACDF C3-5. She has primarily radicular left arm pain in addition to neck pain. She does have clearance from her primary care provider. She has  appointment with physical therapy scheduled for Aspen collar.  Cervical MRI shows loss of normal lordosis consistent with what was seen on the x-rays primarily upper cervical spine C3-4 C4-5. There is advanced degenerative disc disease at both these levels as well. She has an acute C4-5 disc herniation causing C5 nerve compression. At this point time I do believe that surgical intervention is reasonable. She has significant neck and radicular C5 pain. I would recommend moving forward with a two-level ACDF  to address the sagittal malalignment, degenerative cervical disc disease, and the nerve compression from the disc herniation. I have gone over the MRI and treatment options with the patient and she is expressed understanding. We will move forward with a two-level ACDF in the near future. All risks, benefits, and alternatives were explained to the patient and consent was obtained.   Plan:   Risks and benefits of surgery were discussed with the patient. These include: Infection, bleeding, death, stroke, paralysis, ongoing or worse pain, need for additional surgery, nonunion, leak of spinal fluid, adjacent segment degeneration requiring additional fusion surgery. Pseudoarthrosis (nonunion)requiring supplemental posterior fixation. Throat pain, swallowing difficulties, hoarseness or change in voice.  I reviewed the patient's medication list with her. She is not on any blood thinners. Not using any aspirin. She is using meloxicam which I have advised her to discontinue 1 week prior to surgery. I also advised her to avoid any over-the-counter NSAIDs. She expressed understanding of this. Also recommend she discontinue over-the-counter vitamins and supplements.  We have also discussed the post-operative recovery period to include: bathing/showering restrictions, wound healing, activity (and driving) restrictions, medications/pain mangement.  We have also discussed post-operative redflags to include: signs and symptoms of postoperative infection, DVT/PE, hematoms. etc.  Discharge instructions provided to the patient and reviewed with her today. All questions invited and answered  Follow-up: 2 weeks postop

## 2021-11-22 NOTE — H&P (Deleted)
  The note originally documented on this encounter has been moved the the encounter in which it belongs.  

## 2021-12-02 NOTE — Progress Notes (Addendum)
Surgical Instructions    Your procedure is scheduled on 12/05/21.  Report to [facility] Main Entrance "A" at 11:20 A.M., then check in with the Admitting office.  Call this number if you have problems the morning of surgery:  [phone]   If you have any questions prior to your surgery date call [phone]: Open Monday-Friday 8am-4pm    Remember:  Do not eat after midnight the night before your surgery  You may drink clear liquids until 10:20  the morning of your surgery.   Clear liquids allowed are: Water, Non-Citrus Juices (without pulp), Carbonated Beverages, Clear Tea, Black Coffee ONLY (NO MILK, CREAM OR POWDERED CREAMER of any kind), and Gatorade    Take these medicines the morning of surgery with A SIP OF WATER:  DULoxetine (CYMBALTA) SUMAtriptan (IMITREX) - if needed   As of today, STOP taking any Aspirin (unless otherwise instructed by your surgeon) meloxicam (MOBIC), Aleve, Naproxen, Ibuprofen, Motrin, Advil, Goody's, BC's, all herbal medications, fish oil, and all vitamins.   After your COVID test   You are not required to quarantine however you are required to wear a well-fitting mask when you are out and around people not in your household.  If your mask becomes wet or soiled, replace with a new one.  Wash your hands often with soap and water for 20 seconds or clean your hands with an alcohol-based hand sanitizer that contains at least 60% alcohol.  Do not share personal items.  Notify your provider: if you are in close contact with someone who has COVID  or if you develop a fever of 100.4 or greater, sneezing, cough, sore throat, shortness of breath or body aches.          Do not wear jewelry or makeup Do not wear lotions, powders, perfumes or deodorant. Do not shave 48 hours prior to surgery.  Do not bring valuables to the hospital. DO Not wear nail polish, gel polish, artificial nails, or any other type of covering on natural nails including finger and  toenails. If patients have artificial nails, gel coating, etc. that need to be removed by a nail salon, please have this removed prior to surgery or surgery may need to be canceled/delayed if the surgeon/ anesthesia feels like the patient is unable to be adequately monitored.             [facility] is not responsible for any belongings or valuables.  Do NOT Smoke (Tobacco/Vaping)  24 hours prior to your procedure  If you use a CPAP at night, you may bring your mask for your overnight stay.   Contacts, glasses, hearing aids, dentures or partials may not be worn into surgery, please bring cases for these belongings   For patients admitted to the hospital, discharge time will be determined by your treatment team.   Patients discharged the day of surgery will not be allowed to drive home, and someone needs to stay with them for 24 hours.  NO VISITORS WILL BE ALLOWED IN PRE-OP WHERE PATIENTS ARE PREPPED FOR SURGERY.  ONLY 1 SUPPORT PERSON MAY BE PRESENT IN THE WAITING ROOM WHILE YOU ARE IN SURGERY.  IF YOU ARE TO BE ADMITTED, ONCE YOU ARE IN YOUR ROOM YOU WILL BE ALLOWED TWO (2) VISITORS. 1 (ONE) VISITOR MAY STAY OVERNIGHT BUT MUST ARRIVE TO THE ROOM BY 8pm.  Minor children may have two parents present. Special consideration for safety and communication needs will be reviewed on a case by case basis.  Special instructions:    Oral Hygiene is also important to reduce your risk of infection.  Remember - BRUSH YOUR TEETH THE MORNING OF SURGERY WITH YOUR REGULAR TOOTHPASTE   [facility]- Preparing For Surgery  Before surgery, you can play an important role. Because skin is not sterile, your skin needs to be as free of germs as possible. You can reduce the number of germs on your skin by washing with CHG (chlorahexidine gluconate) Soap before surgery.  CHG is an antiseptic cleaner which kills germs and bonds with the skin to continue killing germs even after washing.     Please do not use if you  have an allergy to CHG or antibacterial soaps. If your skin becomes reddened/irritated stop using the CHG.  Do not shave (including legs and underarms) for at least 48 hours prior to first CHG shower. It is OK to shave your face.  Please follow these instructions carefully.     Shower the NIGHT BEFORE SURGERY and the MORNING OF SURGERY with CHG Soap.   If you chose to wash your hair, wash your hair first as usual with your normal shampoo. After you shampoo, rinse your hair and body thoroughly to remove the shampoo.  Then [employer] and genitals (private parts) with your normal soap and rinse thoroughly to remove soap.  After that Use CHG Soap as you would any other liquid soap. You can apply CHG directly to the skin and wash gently with a scrungie or a clean washcloth.   Apply the CHG Soap to your body ONLY FROM THE NECK DOWN.  Do not use on open wounds or open sores. Avoid contact with your eyes, ears, mouth and genitals (private parts). Wash Face and genitals (private parts)  with your normal soap.   Wash thoroughly, paying special attention to the area where your surgery will be performed.  Thoroughly rinse your body with warm water from the neck down.  DO NOT shower/wash with your normal soap after using and rinsing off the CHG Soap.  Pat yourself dry with a CLEAN TOWEL.  Wear CLEAN PAJAMAS to bed the night before surgery  Place CLEAN SHEETS on your bed the night before your surgery  DO NOT SLEEP WITH PETS.   Day of Surgery:  Take a shower with CHG soap. Wear Clean/Comfortable clothing the morning of surgery Do not apply any deodorants/lotions.   Remember to brush your teeth WITH YOUR REGULAR TOOTHPASTE.   Please read over the following fact sheets that you were given.

## 2021-12-03 ENCOUNTER — Other Ambulatory Visit: Payer: Self-pay

## 2021-12-03 ENCOUNTER — Encounter (HOSPITAL_COMMUNITY): Payer: Self-pay

## 2021-12-03 ENCOUNTER — Encounter (HOSPITAL_COMMUNITY)
Admission: RE | Admit: 2021-12-03 | Discharge: 2021-12-03 | Disposition: A | Discharge status: Home / Self Care | Payer: BC Managed Care – PPO | Source: Ambulatory Visit | Attending: Orthopedic Surgery | Admitting: Orthopedic Surgery

## 2021-12-03 DIAGNOSIS — Z01812 Encounter for preprocedural laboratory examination: Secondary | ICD-10-CM | POA: Insufficient documentation

## 2021-12-03 DIAGNOSIS — Z8774 Personal history of (corrected) congenital malformations of heart and circulatory system: Secondary | ICD-10-CM | POA: Insufficient documentation

## 2021-12-03 DIAGNOSIS — Z01818 Encounter for other preprocedural examination: Secondary | ICD-10-CM

## 2021-12-03 DIAGNOSIS — Z20822 Contact with and (suspected) exposure to covid-19: Secondary | ICD-10-CM | POA: Diagnosis not present

## 2021-12-03 LAB — URINALYSIS, ROUTINE W REFLEX MICROSCOPIC
Bilirubin Urine: NEGATIVE
Glucose, UA: NEGATIVE mg/dL
Hgb urine dipstick: NEGATIVE
Ketones, ur: NEGATIVE mg/dL
Leukocytes,Ua: NEGATIVE
Nitrite: NEGATIVE
Protein, ur: NEGATIVE mg/dL
Specific Gravity, Urine: 1.006 (ref 1.005–1.030)
pH: 6 (ref 5.0–8.0)

## 2021-12-03 LAB — BASIC METABOLIC PANEL
Anion gap: 5 (ref 5–15)
BUN: 11 mg/dL (ref 6–20)
CO2: 24 mmol/L (ref 22–32)
Calcium: 8.9 mg/dL (ref 8.9–10.3)
Chloride: 105 mmol/L (ref 98–111)
Creatinine, Ser: 0.65 mg/dL (ref 0.44–1.00)
GFR, Estimated: >60 mL/min (ref >60)
Glucose, Bld: 90 mg/dL (ref 70–99)
Potassium: 3.5 mmol/L (ref 3.5–5.1)
Sodium: 134 mmol/L — ABNORMAL LOW (ref 135–145)

## 2021-12-03 LAB — TYPE AND SCREEN
ABO/RH(D): O POS
Antibody Screen: NEGATIVE

## 2021-12-03 LAB — CBC
HCT: 43 % (ref 36.0–46.0)
Hemoglobin: 14.1 g/dL (ref 12.0–15.0)
MCH: 30.2 pg (ref 26.0–34.0)
MCHC: 32.8 g/dL (ref 30.0–36.0)
MCV: 92.1 fL (ref 80.0–100.0)
Platelets: 266 10*3/uL (ref 150–400)
RBC: 4.67 MIL/uL (ref 3.87–5.11)
RDW: 12.8 % (ref 11.5–15.5)
WBC: 6.9 10*3/uL (ref 4.0–10.5)
nRBC: 0 % (ref 0.0–0.2)

## 2021-12-03 LAB — APTT: aPTT: 29 seconds (ref 24–36)

## 2021-12-03 LAB — PROTIME-INR
INR: 1 (ref 0.8–1.2)
Prothrombin Time: 12.7 seconds (ref 11.4–15.2)

## 2021-12-03 LAB — SURGICAL PCR SCREEN
MRSA, PCR: NEGATIVE
Staphylococcus aureus: NEGATIVE

## 2021-12-03 NOTE — Progress Notes (Signed)
PCP: Francee Nodal, MD Cardiologist: Ginger Carne, MD  EKG: 12/03/21 CXR: 12/31/16 ECHO: 02/01/21 Stress Test: >10 years.  Normal per patient Cardiac Cath: denies  Fasting Blood Sugar-na Checks Blood Sugar__na_ times a day  OSA/CPAP: No  ASA/Blood Thinner: No  Covid test 12/03/21  Anesthesia Review: Yes, cardiac history.  Spoke to [place], [place].  Patient denies shortness of breath, fever, cough, and chest pain at PAT appointment.  Patient verbalized understanding of instructions provided today at the PAT appointment.  Patient asked to review instructions at home and day of surgery.

## 2021-12-04 LAB — SARS CORONAVIRUS 2 (TAT 6-24 HRS): SARS Coronavirus 2: NEGATIVE

## 2021-12-04 NOTE — Progress Notes (Signed)
Anesthesia Chart Review:  History of PDA s/p repair.  Most recent echo 02/01/2021 showed normal LVEF, normal valves.  Preop labs reviewed, unremarkable.  EKG 12/03/2021: NSR.  Rate 65.  TTE 02/01/2021 (Care Everywhere): SUMMARY  Left ventricular systolic function is normal.  LV ejection fraction = 55-60%.  The aortic valve is trileaflet.  There is no significant valvular stenosis or regurgitation.  The pulmonary artery is normal size.  There is no comparison study available.   Event monitor 11/21/2017 (Care Everywhere): CONCLUSIONS:  1.  normal Holter monitor.  2.  Arrhythmia not present  3.  Symptoms were reported  4.  Symptomswere not correlated with arrhythmias     Zannie Cove [facility]/Anesthesiology Phone [phone] 12/04/2021 10:13 AM

## 2021-12-04 NOTE — Anesthesia Preprocedure Evaluation (Addendum)
Anesthesia Evaluation  Patient identified by MRN, date of birth, ID band Patient awake    Reviewed: Allergy & Precautions, NPO status , Patient's Chart, lab work & pertinent test results  Airway Mallampati: I  TM Distance: >3 FB Neck ROM: Full    Dental no notable dental hx.    Pulmonary asthma (childhood) , former smoker,    Pulmonary exam normal breath sounds clear to auscultation       Cardiovascular negative cardio ROS Normal cardiovascular exam Rhythm:Regular Rate:Normal  ECG: NSR, rate 65   Neuro/Psych negative neurological ROS  negative psych ROS   GI/Hepatic negative GI ROS, Neg liver ROS,   Endo/Other  negative endocrine ROS  Renal/GU negative Renal ROS     Musculoskeletal negative musculoskeletal ROS (+)   Abdominal   Peds  Hematology negative hematology ROS (+)   Anesthesia Other Findings Herniated disc, cervical radiculopathy  Reproductive/Obstetrics S/p hysterectomy                           Anesthesia Physical Anesthesia Plan  ASA: 2  Anesthesia Plan: General   Post-op Pain Management:    Induction: Intravenous  PONV Risk Score and Plan: 3 and Ondansetron, Dexamethasone, Midazolam and Treatment may vary due to age or medical condition  Airway Management Planned: Oral ETT  Additional Equipment:   Intra-op Plan:   Post-operative Plan: Extubation in OR  Informed Consent: I have reviewed the patients History and Physical, chart, labs and discussed the procedure including the risks, benefits and alternatives for the proposed anesthesia with the patient or authorized representative who has indicated his/her understanding and acceptance.     Dental advisory given  Plan Discussed with: CRNA  Anesthesia Plan Comments: (PAT note by Antionette Poles, PA-C: History of PDA s/p repair.  Most recent echo 02/01/2021 showed normal LVEF, normal valves.  Preop labs reviewed,  unremarkable.  EKG 12/03/2021: NSR.  Rate 65.  TTE 02/01/2021 (Care Everywhere): SUMMARY  Left ventricular systolic function is normal.  LV ejection fraction = 55-60%.  The aortic valve is trileaflet.  There is no significant valvular stenosis or regurgitation.  The pulmonary artery is normal size.  There is no comparison study available.   Event monitor 11/21/2017 (Care Everywhere): CONCLUSIONS:  1. normal Holter monitor.  2. Arrhythmia not present  3. Symptoms were reported  4. Symptomswere not correlated with arrhythmias  )       Anesthesia Quick Evaluation

## 2021-12-05 ENCOUNTER — Ambulatory Visit (HOSPITAL_COMMUNITY): Payer: BC Managed Care – PPO | Admitting: Anesthesiology

## 2021-12-05 ENCOUNTER — Ambulatory Visit (HOSPITAL_COMMUNITY): Payer: BC Managed Care – PPO | Admitting: Physician Assistant

## 2021-12-05 ENCOUNTER — Observation Stay (HOSPITAL_COMMUNITY)
Admission: RE | Admit: 2021-12-05 | Discharge: 2021-12-06 | Disposition: A | Discharge status: Home / Self Care | Payer: BC Managed Care – PPO | Attending: Orthopedic Surgery | Admitting: Orthopedic Surgery

## 2021-12-05 ENCOUNTER — Other Ambulatory Visit: Payer: Self-pay

## 2021-12-05 ENCOUNTER — Ambulatory Visit (HOSPITAL_COMMUNITY): Payer: BC Managed Care – PPO

## 2021-12-05 ENCOUNTER — Encounter (HOSPITAL_COMMUNITY): Payer: Self-pay | Admitting: Orthopedic Surgery

## 2021-12-05 ENCOUNTER — Ambulatory Visit (HOSPITAL_COMMUNITY)
Admission: RE | Disposition: A | Discharge status: Home / Self Care | Payer: Self-pay | Source: Home / Self Care | Attending: Orthopedic Surgery

## 2021-12-05 DIAGNOSIS — Z419 Encounter for procedure for purposes other than remedying health state, unspecified: Secondary | ICD-10-CM

## 2021-12-05 DIAGNOSIS — M50121 Cervical disc disorder at C4-C5 level with radiculopathy: Principal | ICD-10-CM | POA: Insufficient documentation

## 2021-12-05 DIAGNOSIS — M5011 Cervical disc disorder with radiculopathy,  high cervical region: Secondary | ICD-10-CM | POA: Diagnosis not present

## 2021-12-05 DIAGNOSIS — M5020 Other cervical disc displacement, unspecified cervical region: Secondary | ICD-10-CM | POA: Diagnosis present

## 2021-12-05 DIAGNOSIS — J45909 Unspecified asthma, uncomplicated: Secondary | ICD-10-CM | POA: Insufficient documentation

## 2021-12-05 HISTORY — PX: ANTERIOR CERVICAL DECOMP/DISCECTOMY FUSION: SHX1161

## 2021-12-05 LAB — ABO/RH: ABO/RH(D): O POS

## 2021-12-05 SURGERY — ANTERIOR CERVICAL DECOMPRESSION/DISCECTOMY FUSION 2 LEVELS
Anesthesia: General | Site: Spine Cervical

## 2021-12-05 MED ORDER — THROMBIN 20000 UNITS EX SOLR
CUTANEOUS | Status: DC | PRN
  Administered 2021-12-05: 16:00:00 20 mL via TOPICAL

## 2021-12-05 MED ORDER — AMISULPRIDE (ANTIEMETIC) 5 MG/2ML IV SOLN: 10.0000 mg | Freq: Once | INTRAVENOUS | Status: DC | PRN

## 2021-12-05 MED ORDER — MIDAZOLAM HCL 2 MG/2ML IJ SOLN
INTRAMUSCULAR | Status: AC
  Filled 2021-12-05: qty 2

## 2021-12-05 MED ORDER — DEXAMETHASONE SODIUM PHOSPHATE 4 MG/ML IJ SOLN
4.0000 mg | Freq: Four times a day (QID) | INTRAMUSCULAR | Status: DC
  Administered 2021-12-05 – 2021-12-06 (×3): 4 mg via INTRAVENOUS
  Filled 2021-12-05 (×3): qty 1

## 2021-12-05 MED ORDER — FLEET ENEMA 7-19 GM/118ML RE ENEM: 1.0000 | ENEMA | Freq: Once | RECTAL | Status: DC | PRN

## 2021-12-05 MED ORDER — SODIUM CHLORIDE 0.9% FLUSH: 3.0000 mL | INTRAVENOUS | Status: DC | PRN

## 2021-12-05 MED ORDER — LACTATED RINGERS IV SOLN: INTRAVENOUS | Status: DC

## 2021-12-05 MED ORDER — HEMOSTATIC AGENTS (NO CHARGE) OPTIME
TOPICAL | Status: DC | PRN
  Administered 2021-12-05 (×2): 1 via TOPICAL

## 2021-12-05 MED ORDER — ACETAMINOPHEN 10 MG/ML IV SOLN
INTRAVENOUS | Status: AC
  Filled 2021-12-05: qty 100

## 2021-12-05 MED ORDER — DULOXETINE HCL 30 MG PO CPEP
30.0000 mg | ORAL_CAPSULE | Freq: Every day | ORAL | Status: DC
  Administered 2021-12-05: 20:00:00 30 mg via ORAL
  Filled 2021-12-05: qty 1

## 2021-12-05 MED ORDER — DEXAMETHASONE SODIUM PHOSPHATE 10 MG/ML IJ SOLN
INTRAMUSCULAR | Status: AC
  Filled 2021-12-05: qty 1

## 2021-12-05 MED ORDER — PHENOL 1.4 % MT LIQD: 1.0000 | OROMUCOSAL | Status: DC | PRN

## 2021-12-05 MED ORDER — CEFAZOLIN SODIUM-DEXTROSE 2-4 GM/100ML-% IV SOLN
2.0000 g | INTRAVENOUS | Status: AC
  Administered 2021-12-05: 15:00:00 2 g via INTRAVENOUS

## 2021-12-05 MED ORDER — BUPIVACAINE-EPINEPHRINE (PF) 0.25% -1:200000 IJ SOLN
INTRAMUSCULAR | Status: AC
  Filled 2021-12-05: qty 30

## 2021-12-05 MED ORDER — LACTATED RINGERS IV SOLN: INTRAVENOUS | Status: DC | PRN

## 2021-12-05 MED ORDER — ROCURONIUM BROMIDE 10 MG/ML (PF) SYRINGE
PREFILLED_SYRINGE | INTRAVENOUS | Status: DC | PRN
  Administered 2021-12-05: 20 mg via INTRAVENOUS
  Administered 2021-12-05: 30 mg via INTRAVENOUS
  Administered 2021-12-05: 20 mg via INTRAVENOUS
  Administered 2021-12-05: 50 mg via INTRAVENOUS

## 2021-12-05 MED ORDER — CEFAZOLIN SODIUM-DEXTROSE 2-4 GM/100ML-% IV SOLN
INTRAVENOUS | Status: AC
  Filled 2021-12-05: qty 100

## 2021-12-05 MED ORDER — HYDROMORPHONE HCL 1 MG/ML IJ SOLN
1.0000 mg | INTRAMUSCULAR | Status: AC | PRN
  Administered 2021-12-05 – 2021-12-06 (×2): 1 mg via INTRAVENOUS
  Filled 2021-12-05 (×2): qty 1

## 2021-12-05 MED ORDER — PROMETHAZINE HCL 25 MG/ML IJ SOLN: 6.2500 mg | INTRAMUSCULAR | Status: DC | PRN

## 2021-12-05 MED ORDER — THROMBIN 20000 UNITS EX SOLR
CUTANEOUS | Status: AC
  Filled 2021-12-05: qty 20000

## 2021-12-05 MED ORDER — CYCLOBENZAPRINE HCL 10 MG PO TABS
10.0000 mg | ORAL_TABLET | Freq: Three times a day (TID) | ORAL | Status: DC | PRN
  Administered 2021-12-05 – 2021-12-06 (×2): 10 mg via ORAL
  Filled 2021-12-05 (×2): qty 1

## 2021-12-05 MED ORDER — SODIUM CHLORIDE 0.9% FLUSH: 3.0000 mL | Freq: Two times a day (BID) | INTRAVENOUS | Status: DC

## 2021-12-05 MED ORDER — 0.9 % SODIUM CHLORIDE (POUR BTL) OPTIME
TOPICAL | Status: DC | PRN
  Administered 2021-12-05: 16:00:00 1000 mL

## 2021-12-05 MED ORDER — ACETAMINOPHEN 10 MG/ML IV SOLN
INTRAVENOUS | Status: DC | PRN
  Administered 2021-12-05: 1000 mg via INTRAVENOUS

## 2021-12-05 MED ORDER — METHOCARBAMOL 500 MG PO TABS
500.0000 mg | ORAL_TABLET | Freq: Four times a day (QID) | ORAL | Status: DC | PRN
  Filled 2021-12-05: qty 1

## 2021-12-05 MED ORDER — OXYCODONE HCL 5 MG/5ML PO SOLN: 5.0000 mg | Freq: Once | ORAL | Status: DC | PRN

## 2021-12-05 MED ORDER — ONDANSETRON HCL 4 MG/2ML IJ SOLN
INTRAMUSCULAR | Status: DC | PRN
  Administered 2021-12-05: 4 mg via INTRAVENOUS

## 2021-12-05 MED ORDER — CHLORHEXIDINE GLUCONATE 0.12 % MT SOLN
OROMUCOSAL | Status: AC
  Administered 2021-12-05: 12:00:00 15 mL via OROMUCOSAL
  Filled 2021-12-05: qty 15

## 2021-12-05 MED ORDER — DEXAMETHASONE SODIUM PHOSPHATE 10 MG/ML IJ SOLN
INTRAMUSCULAR | Status: DC | PRN
  Administered 2021-12-05: 10 mg via INTRAVENOUS

## 2021-12-05 MED ORDER — LIDOCAINE 2% (20 MG/ML) 5 ML SYRINGE
INTRAMUSCULAR | Status: DC | PRN
  Administered 2021-12-05: 60 mg via INTRAVENOUS

## 2021-12-05 MED ORDER — OXYCODONE HCL 5 MG PO TABS
10.0000 mg | ORAL_TABLET | ORAL | Status: DC | PRN
  Administered 2021-12-05 – 2021-12-06 (×3): 10 mg via ORAL
  Filled 2021-12-05 (×3): qty 2

## 2021-12-05 MED ORDER — MIDAZOLAM HCL 2 MG/2ML IJ SOLN
INTRAMUSCULAR | Status: DC | PRN
Start: 2021-12-05 — End: 2021-12-05
  Administered 2021-12-05: 2 mg via INTRAVENOUS

## 2021-12-05 MED ORDER — DEXMEDETOMIDINE (PRECEDEX) IN NS 20 MCG/5ML (4 MCG/ML) IV SYRINGE
PREFILLED_SYRINGE | INTRAVENOUS | Status: AC
  Filled 2021-12-05: qty 5

## 2021-12-05 MED ORDER — POLYETHYLENE GLYCOL 3350 17 G PO PACK: 17.0000 g | PACK | Freq: Every day | ORAL | Status: DC | PRN

## 2021-12-05 MED ORDER — OXYCODONE HCL 5 MG PO TABS: 5.0000 mg | ORAL_TABLET | ORAL | Status: DC | PRN

## 2021-12-05 MED ORDER — ONDANSETRON HCL 4 MG/2ML IJ SOLN
INTRAMUSCULAR | Status: AC
  Filled 2021-12-05: qty 2

## 2021-12-05 MED ORDER — ACETAMINOPHEN 650 MG RE SUPP: 650.0000 mg | RECTAL | Status: DC | PRN

## 2021-12-05 MED ORDER — METHOCARBAMOL 1000 MG/10ML IJ SOLN
500.0000 mg | Freq: Four times a day (QID) | INTRAVENOUS | Status: DC | PRN
  Filled 2021-12-05: qty 5

## 2021-12-05 MED ORDER — DEXAMETHASONE 4 MG PO TABS: 4.0000 mg | ORAL_TABLET | Freq: Four times a day (QID) | ORAL | Status: DC

## 2021-12-05 MED ORDER — SODIUM CHLORIDE 0.9 % IV SOLN: 250.0000 mL | INTRAVENOUS | Status: DC

## 2021-12-05 MED ORDER — METHOCARBAMOL 500 MG PO TABS: 500.0000 mg | ORAL_TABLET | Freq: Three times a day (TID) | ORAL | 0 refills | Status: AC | PRN

## 2021-12-05 MED ORDER — MENTHOL 3 MG MT LOZG: 1.0000 | LOZENGE | OROMUCOSAL | Status: DC | PRN

## 2021-12-05 MED ORDER — ACETAMINOPHEN 325 MG PO TABS
650.0000 mg | ORAL_TABLET | ORAL | Status: DC | PRN
  Administered 2021-12-05 – 2021-12-06 (×3): 650 mg via ORAL
  Filled 2021-12-05 (×3): qty 2

## 2021-12-05 MED ORDER — SUGAMMADEX SODIUM 200 MG/2ML IV SOLN
INTRAVENOUS | Status: DC | PRN
  Administered 2021-12-05: 200 mg via INTRAVENOUS

## 2021-12-05 MED ORDER — CEFAZOLIN SODIUM-DEXTROSE 1-4 GM/50ML-% IV SOLN
1.0000 g | Freq: Three times a day (TID) | INTRAVENOUS | Status: AC
  Administered 2021-12-05 – 2021-12-06 (×2): 1 g via INTRAVENOUS
  Filled 2021-12-05 (×2): qty 50

## 2021-12-05 MED ORDER — FENTANYL CITRATE (PF) 100 MCG/2ML IJ SOLN
INTRAMUSCULAR | Status: AC
  Filled 2021-12-05: qty 2

## 2021-12-05 MED ORDER — ONDANSETRON HCL 4 MG PO TABS: 4.0000 mg | ORAL_TABLET | Freq: Four times a day (QID) | ORAL | Status: DC | PRN

## 2021-12-05 MED ORDER — FENTANYL CITRATE (PF) 250 MCG/5ML IJ SOLN
INTRAMUSCULAR | Status: AC
  Filled 2021-12-05: qty 5

## 2021-12-05 MED ORDER — ORAL CARE MOUTH RINSE: 15.0000 mL | Freq: Once | OROMUCOSAL | Status: AC

## 2021-12-05 MED ORDER — OXYCODONE-ACETAMINOPHEN 10-325 MG PO TABS: 1.0000 | ORAL_TABLET | Freq: Four times a day (QID) | ORAL | 0 refills | Status: AC | PRN

## 2021-12-05 MED ORDER — FENTANYL CITRATE (PF) 100 MCG/2ML IJ SOLN
25.0000 ug | INTRAMUSCULAR | Status: DC | PRN
  Administered 2021-12-05: 19:00:00 25 ug via INTRAVENOUS

## 2021-12-05 MED ORDER — DEXMEDETOMIDINE (PRECEDEX) IN NS 20 MCG/5ML (4 MCG/ML) IV SYRINGE
PREFILLED_SYRINGE | INTRAVENOUS | Status: DC | PRN
  Administered 2021-12-05: 8 ug via INTRAVENOUS

## 2021-12-05 MED ORDER — FENTANYL CITRATE (PF) 250 MCG/5ML IJ SOLN
INTRAMUSCULAR | Status: DC | PRN
  Administered 2021-12-05: 50 ug via INTRAVENOUS
  Administered 2021-12-05: 150 ug via INTRAVENOUS
  Administered 2021-12-05: 50 ug via INTRAVENOUS

## 2021-12-05 MED ORDER — ONDANSETRON HCL 4 MG PO TABS: 4.0000 mg | ORAL_TABLET | Freq: Three times a day (TID) | ORAL | 0 refills | Status: DC | PRN

## 2021-12-05 MED ORDER — PROPOFOL 10 MG/ML IV BOLUS
INTRAVENOUS | Status: DC | PRN
  Administered 2021-12-05: 150 mg via INTRAVENOUS
  Administered 2021-12-05: 50 mg via INTRAVENOUS

## 2021-12-05 MED ORDER — BUPIVACAINE-EPINEPHRINE 0.25% -1:200000 IJ SOLN
INTRAMUSCULAR | Status: DC | PRN
  Administered 2021-12-05: 4 mL

## 2021-12-05 MED ORDER — PROPOFOL 10 MG/ML IV BOLUS
INTRAVENOUS | Status: AC
  Filled 2021-12-05: qty 20

## 2021-12-05 MED ORDER — ONDANSETRON HCL 4 MG/2ML IJ SOLN: 4.0000 mg | Freq: Four times a day (QID) | INTRAMUSCULAR | Status: DC | PRN

## 2021-12-05 MED ORDER — OXYCODONE HCL 5 MG PO TABS: 5.0000 mg | ORAL_TABLET | Freq: Once | ORAL | Status: DC | PRN

## 2021-12-05 MED ORDER — THROMBIN (RECOMBINANT) 20000 UNITS EX SOLR
CUTANEOUS | Status: AC
  Filled 2021-12-05: qty 20000

## 2021-12-05 MED ORDER — CHLORHEXIDINE GLUCONATE 0.12 % MT SOLN: 15.0000 mL | Freq: Once | OROMUCOSAL | Status: AC

## 2021-12-05 MED ORDER — ACETAMINOPHEN 10 MG/ML IV SOLN: 1000.0000 mg | Freq: Once | INTRAVENOUS | Status: DC | PRN

## 2021-12-05 SURGICAL SUPPLY — 69 items
BAG COUNTER SPONGE SURGICOUNT (BAG) ×2 IMPLANT
BAG SURGICOUNT SPONGE COUNTING (BAG) ×1
BLADE CLIPPER SURG (BLADE) IMPLANT
BONE MATRIX OSTEOCEL PRO SM (Bone Implant) ×2 IMPLANT
BUR EGG ELITE 4.0 (BURR) IMPLANT
BUR EGG ELITE 4.0MM (BURR)
BUR MATCHSTICK NEURO 3.0 LAGG (BURR) IMPLANT
CABLE BIPOLOR RESECTION CORD (MISCELLANEOUS) ×3 IMPLANT
CAGE SPNL 6D 12XSM 14X7X (Cage) IMPLANT
CANISTER SUCT 3000ML PPV (MISCELLANEOUS) ×3 IMPLANT
CLOSURE STERI-STRIP 1/2X4 (GAUZE/BANDAGES/DRESSINGS)
CLOSURE WOUND 1/2 X4 (GAUZE/BANDAGES/DRESSINGS) ×1
CLSR STERI-STRIP ANTIMIC 1/2X4 (GAUZE/BANDAGES/DRESSINGS) ×1 IMPLANT
COVER MAYO STAND STRL (DRAPES) ×9 IMPLANT
COVER PLATE (Plate) ×4 IMPLANT
COVER SURGICAL LIGHT HANDLE (MISCELLANEOUS) ×4 IMPLANT
DRAIN CHANNEL 15F RND FF W/TCR (WOUND CARE) IMPLANT
DRAPE C-ARM 42X72 X-RAY (DRAPES) ×3 IMPLANT
DRAPE POUCH INSTRU U-SHP 10X18 (DRAPES) ×3 IMPLANT
DRAPE SURG 17X23 STRL (DRAPES) ×3 IMPLANT
DRAPE U-SHAPE 47X51 STRL (DRAPES) ×3 IMPLANT
DRSG OPSITE POSTOP 3X4 (GAUZE/BANDAGES/DRESSINGS) ×1 IMPLANT
DRSG OPSITE POSTOP 4X6 (GAUZE/BANDAGES/DRESSINGS) ×2 IMPLANT
DURAPREP 26ML APPLICATOR (WOUND CARE) ×3 IMPLANT
ELECT COATED BLADE 2.86 ST (ELECTRODE) ×3 IMPLANT
ELECT PENCIL ROCKER SW 15FT (MISCELLANEOUS) ×3 IMPLANT
ELECT REM PT RETURN 9FT ADLT (ELECTROSURGICAL) ×3
ELECTRODE REM PT RTRN 9FT ADLT (ELECTROSURGICAL) ×1 IMPLANT
FUSION TCS NANOLOCK 7MM 6DEG (Cage) ×4 IMPLANT
GLOVE SURG ENC MOIS LTX SZ6.5 (GLOVE) ×3 IMPLANT
GLOVE SURG MICRO LTX SZ8.5 (GLOVE) ×3 IMPLANT
GLOVE SURG UNDER POLY LF SZ6.5 (GLOVE) ×3 IMPLANT
GLOVE SURG UNDER POLY LF SZ8.5 (GLOVE) ×3 IMPLANT
GOWN STRL REUS W/ TWL LRG LVL3 (GOWN DISPOSABLE) ×1 IMPLANT
GOWN STRL REUS W/TWL 2XL LVL3 (GOWN DISPOSABLE) ×3 IMPLANT
GOWN STRL REUS W/TWL LRG LVL3 (GOWN DISPOSABLE) ×2
KIT BASIN OR (CUSTOM PROCEDURE TRAY) ×3 IMPLANT
KIT TURNOVER KIT B (KITS) ×3 IMPLANT
NDL SPNL 18GX3.5 QUINCKE PK (NEEDLE) ×1 IMPLANT
NEEDLE HYPO 22GX1.5 SAFETY (NEEDLE) ×3 IMPLANT
NEEDLE SPNL 18GX3.5 QUINCKE PK (NEEDLE) ×3 IMPLANT
NS IRRIG 1000ML POUR BTL (IV SOLUTION) ×3 IMPLANT
PACK ORTHO CERVICAL (CUSTOM PROCEDURE TRAY) ×3 IMPLANT
PACK UNIVERSAL I (CUSTOM PROCEDURE TRAY) ×3 IMPLANT
PAD ARMBOARD 7.5X6 YLW CONV (MISCELLANEOUS) ×6 IMPLANT
PATTIES SURGICAL .25X.25 (GAUZE/BANDAGES/DRESSINGS) ×1 IMPLANT
PIN DISTRACTION MAXCESS-C 14 (PIN) ×4 IMPLANT
PLATE LOCK ENDO TCS (Plate) ×2 IMPLANT
PLATE LOCK ENDO TCS F/COVER (Plate) IMPLANT
POSITIONER HEAD DONUT 9IN (MISCELLANEOUS) ×3 IMPLANT
RESTRAINT LIMB HOLDER UNIV (RESTRAINTS) ×3 IMPLANT
SCREW LOCKING 14MMX3.5MM (Screw) ×8 IMPLANT
SPONGE INTESTINAL PEANUT (DISPOSABLE) ×5 IMPLANT
SPONGE SURGIFOAM ABS GEL 100 (HEMOSTASIS) ×3 IMPLANT
SPONGE T-LAP 4X18 ~~LOC~~+RFID (SPONGE) ×6 IMPLANT
STRIP CLOSURE SKIN 1/2X4 (GAUZE/BANDAGES/DRESSINGS) ×1 IMPLANT
SURGIFLO W/THROMBIN 8M KIT (HEMOSTASIS) ×2 IMPLANT
SUT BONE WAX W31G (SUTURE) ×3 IMPLANT
SUT MNCRL AB 3-0 PS2 27 (SUTURE) ×3 IMPLANT
SUT SILK 2 0 (SUTURE)
SUT SILK 2-0 18XBRD TIE 12 (SUTURE) IMPLANT
SUT VIC AB 2-0 CT1 18 (SUTURE) ×3 IMPLANT
SYR BULB IRRIG 60ML STRL (SYRINGE) ×3 IMPLANT
SYR CONTROL 10ML LL (SYRINGE) ×3 IMPLANT
TAPE CLOTH 4X10 WHT NS (GAUZE/BANDAGES/DRESSINGS) ×3 IMPLANT
TAPE UMBILICAL COTTON 1/8X30 (MISCELLANEOUS) ×3 IMPLANT
TOWEL GREEN STERILE (TOWEL DISPOSABLE) ×3 IMPLANT
TOWEL GREEN STERILE FF (TOWEL DISPOSABLE) ×3 IMPLANT
WATER STERILE IRR 1000ML POUR (IV SOLUTION) ×3 IMPLANT

## 2021-12-05 NOTE — Transfer of Care (Signed)
Immediate Anesthesia Transfer of Care Note  Patient: Erica Clark  Procedure(s) Performed: ANTERIOR CERVICAL DISCECTOMY/DECOMPRESSION AND FUSION CERVICAL THREE THROUGH FIVE (Spine Cervical)  Patient Location: PACU  Anesthesia Type:General  Level of Consciousness: awake, alert  and oriented  Airway & Oxygen Therapy: Patient Spontanous Breathing  Post-op Assessment: Report given to RN and Post -op Vital signs reviewed and stable  Post vital signs: Reviewed and stable  Last Vitals:  Vitals Value Taken Time  BP 156/75 12/05/21 1828  Temp    Pulse 101 12/05/21 1830  Resp 14 12/05/21 1830  SpO2 94 % 12/05/21 1830  Vitals shown include unvalidated device data.  Last Pain:  Vitals:   12/05/21 1140  TempSrc:   PainSc: 0-No pain         Complications: No notable events documented.

## 2021-12-05 NOTE — Discharge Instructions (Signed)

## 2021-12-05 NOTE — H&P (Signed)
Addendum H&P: Is been no change in the patient's clinical exam since her last office visit of 11/23/2019.  She continues to have significant neck and radicular arm pain.  Plan on moving forward with a two-level ACDF C3-5.  All appropriate risks, benefits, and alternatives to surgery were discussed with the patient and consent was obtained.

## 2021-12-05 NOTE — Anesthesia Postprocedure Evaluation (Signed)
Anesthesia Post Note  Patient: Erica Clark  Procedure(s) Performed: ANTERIOR CERVICAL DISCECTOMY/DECOMPRESSION AND FUSION CERVICAL THREE THROUGH FIVE (Spine Cervical)     Patient location during evaluation: PACU Anesthesia Type: General Level of consciousness: awake Pain management: pain level controlled Vital Signs Assessment: post-procedure vital signs reviewed and stable Respiratory status: spontaneous breathing, nonlabored ventilation, respiratory function stable and patient connected to nasal cannula oxygen Cardiovascular status: blood pressure returned to baseline and stable Postop Assessment: no apparent nausea or vomiting Anesthetic complications: no   No notable events documented.  Last Vitals:  Vitals:   12/05/21 1915 12/05/21 1947  BP: (!) 144/93 (!) 147/77  Pulse: 96 89  Resp: 13 18  Temp: 36.5 C 37 C  SpO2: 93% 96%    Last Pain:  Vitals:   12/05/21 2001  TempSrc:   PainSc: 7                  Lurlean Kernen P Akiko Schexnider

## 2021-12-05 NOTE — Anesthesia Procedure Notes (Signed)
Procedure Name: Intubation Date/Time: 12/05/2021 3:23 PM Performed by: Katina Degree, CRNA Pre-anesthesia Checklist: Patient identified, Emergency Drugs available, Suction available and Patient being monitored Patient Re-evaluated:Patient Re-evaluated prior to induction Oxygen Delivery Method: Circle system utilized Preoxygenation: Pre-oxygenation with 100% oxygen Induction Type: IV induction Ventilation: Mask ventilation without difficulty Laryngoscope Size: Glidescope Grade View: Grade I Tube type: Oral Number of attempts: 1 Airway Equipment and Method: Stylet and Oral airway Placement Confirmation: ETT inserted through vocal cords under direct vision, positive ETCO2 and breath sounds checked- equal and bilateral Secured at: 21 cm Tube secured with: Tape Dental Injury: Teeth and Oropharynx as per pre-operative assessment

## 2021-12-05 NOTE — Brief Op Note (Signed)
12/05/2021  6:20 PM  PATIENT:  Erica Clark  45 y.o. female  PRE-OPERATIVE DIAGNOSIS:  Herniated disc, cervical radiculopathy  POST-OPERATIVE DIAGNOSIS:  Herniated disc, cervical radiculopathy  PROCEDURE:  Procedure(s) with comments: ANTERIOR CERVICAL DISCECTOMY/DECOMPRESSION AND FUSION CERVICAL THREE THROUGH FIVE (N/A) - 4 hrs 3 C-Bed  SURGEON:  Surgeon(s) and Role:    Venita Lick, MD - Primary  PHYSICIAN ASSISTANT:   ASSISTANTS: Voncille Lo, PA   ANESTHESIA:   general  EBL:  25 mL   BLOOD ADMINISTERED:none  DRAINS: none   LOCAL MEDICATIONS USED:  MARCAINE     SPECIMEN:  No Specimen  DISPOSITION OF SPECIMEN:  N/A  COUNTS:  YES  TOURNIQUET:  * No tourniquets in log *  DICTATION: .[facility] Dictation  PLAN OF CARE: Admit for overnight observation  PATIENT DISPOSITION:  PACU - hemodynamically stable.

## 2021-12-05 NOTE — Op Note (Signed)
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  7 mm small lordotic.  14 mm locking screws, with anterior locking plate.  Graft: osteocell  BRIEF HISTORY: Erica Clark is a 45 y.o. female who presents to my office with complaints of significant neck and radicular left arm pain.  Imaging studies demonstrated degenerative cervical disc disease C3-5 with foraminal stenosis and hard disc osteophyte posterior lateral to the left at C4-5.  Attempts at conservative management have failed to alleviate her pain or improve her quality of life.  As result we elected to move forward with surgery all appropriate risks, benefits, and alternatives were discussed with the patient and consent was obtained.  PROCEDURE DETAILS: Patient was brought into the operating room and was properly positioned on the operating room table.  After induction with general anesthesia the patient was endotracheally intubated.  A timeout was taken to confirm all important data: including patient, procedure, and the level. Teds, SCD's were applied.   Anterior cervical spine was prepped and draped in a standard fashion.  Using fluoroscopy identified the C4-5 and C3-4 levels.  A transverse incision was marked out essentially centered over the C4 vertebral body. I infiltrated this incision with quarter percent Marcaine.  Transverse left-sided incision was made and sharp dissection was carried out down to the platysma.  The platysma was sharply incised and I continue with a standard Smith-Robinson  approach to the cervical spine.  I continued dissecting into the deep cervical fascia sweeping the carotid sheath to the left and the trachea and esophagus over to the right.  I then placed a hand-held retractor to mobilize the trachea and esophagus and then used Kitner dissectors to remove the remainder of the prevertebral fascia and completely expose the anterior cervical spine from C3-C5.  A needle was placed into the seat for 5 disc space and an x-ray was taken and I confirmed that I was at the appropriate level.  I then used bipolar electrocautery to mobilize the longus coli muscle from the superior aspect of C3 to the inferior aspect of C5.  Once this was done I was able to pass my retractor blades underneath the longus coli muscle deflated the endotracheal cuff and expanded retractors to the appropriate width.  The endotracheal cuff was then reinflated.  With the C3-4 disc space clearly identified I then proceeded with the discectomy.  Annulotomy was performed with a 15 blade scalpel and I used pituitary rongeurs to remove the bulk of the disc material.  I then used a 2 mm Kerrison to remove the overhanging osteophyte from the inferior aspect of the C3 vertebral body.  I then continued removing disc material using my curettes.  Distraction pins were then placed into the body of C3 and C4 and I distracted the intervertebral space with a lamina spreader and maintained the distraction with the distractor pin set.  This allowed me to continue removing disc with my curettes to posterior annulus.  I then used a fine nerve hook to dissect below the annulus and then used my 1 mm Kerrison rongeur to resect  the posterior annulus and the osteophyte from the posterior aspect of the C4 vertebral body.  I then continue to gently dissect through the posterior longitudinal ligament with my fine nerve hook until I was able to develop a plane underneath the PLL.  Using a 1 mm Kerrison rongeur I resected the PLL.  I could now  resect the osteophyte and uncovertebral joint to adequately decompress the area. using fluoroscopy I confirmed that my nerve root could freely pass under the uncovertebral joint and vertebral body confirming satisfactory decompression.  At this point I used my rasp trials in order to remove the remaining cartilaginous endplate as the bleeding subchondral bone.  The 7 mm small implant provided the best overall fit.  The cage was obtained and packed with the allograft and malleted into position.  I then placed the locking screw through the cage and into the C3 vertebral body and C4 vertebral body.  Both of these screws had excellent purchase.  The locking plate was then secured over these and tightened according to manufacture standards.  With the C3-4 discectomy complete I remove the distraction pin from C3 and placed bone wax into the hole.  At this point the retractors were repositioned to expose the C4-5 disc space level.  Using the same technique I performed an annulotomy and remove the bulk of the disc material.  Distraction pins were placed into the body of C5 and I distracted the C4-5 disc space.  I continued using my curettes to remove all the disc material until I could see the posterior annulus.  Patient had a left posterior lateral disc herniation so using my fine nerve hook I began dissecting into the left lateral recess.  I was able to mobilize the disc fragment and remove it as well as identify osteophyte/hard disc material using a 1 mm Kerrison rongeur I resected this.  I continued to dissect through the posterior longitudinal ligament until I could develop a plane with my nerve hook between the PLL and the thecal sac.  Once this was identified I resected the PLL with a 1 mm Kerrison rongeur and continue to decompress the uncovertebral joint osteophyte.  At this point I was very pleased with the left posterior lateral decompression.  I then carried my dissection over to the right side to adequately  decompress the entire area.  The trial implants were then placed and I elected to use the 7 mm small implant.  Implant was packed with the allograft and malleted into position.  I then locked it in place as I had done at the other level with 2 mm screws.  All screws had excellent purchase.  The locking plate was then applied and secured according manufacture standards.  Imaging studies at this point time demonstrated satisfactory positioning of the 2 intervertebral cages and improvement in her kyphotic alignment.  I irrigated wound copiously normal saline and obtain hemostasis using bipolar cautery and Floseal.  Once I confirmed hemostasis I then removed all the retractors and returned the trachea esophagus to midline.  The platysma was closed with interrupted 2-0 Vicryl sutures and the skin with 3-0 Monocryl.  Steri-Strips and dry dressings were applied and the patient was ultimately extubated transfer the PACU without incident.  The end of the case all needle and sponge counts were correct.  There were no adverse intraoperative events.  Venita Lick, MD 12/05/2021 6:08 PM

## 2021-12-06 DIAGNOSIS — M50121 Cervical disc disorder at C4-C5 level with radiculopathy: Secondary | ICD-10-CM | POA: Diagnosis not present

## 2021-12-06 MED FILL — Thrombin (Recombinant) For Soln 20000 Unit: CUTANEOUS | Qty: 1 | Status: AC

## 2021-12-06 NOTE — Progress Notes (Signed)
Subjective: 1 Day Post-Op Procedure(s) (LRB): ANTERIOR CERVICAL DISCECTOMY/DECOMPRESSION AND FUSION CERVICAL THREE THROUGH FIVE (N/A) Patient reports pain as mild.  Arm pain improved.  No difficulty swallowing.  Tolerating PO without nausea or vomiting.  +void +flatus +ambulation No compliants  Objective: Vital signs in last 24 hours: Temp:  [97.6 F (36.4 C)-98.7 F (37.1 C)] 98.7 F (37.1 C) (12/23 0336) Pulse Rate:  [67-106] 106 (12/23 0336) Resp:  [13-20] 18 (12/23 0336) BP: (118-156)/(73-93) 140/78 (12/23 0336) SpO2:  [93 %-100 %] 94 % (12/23 0336) Weight:  [77.1 kg] 77.1 kg (12/22 1120)  Intake/Output from previous day: 12/22 0701 - 12/23 0700 In: 1400 [I.V.:1300; IV Piggyback:100] Out: 25 [Blood:25] Intake/Output this shift: No intake/output data recorded.  Recent Labs    12/03/21 1445  HGB 14.1   Recent Labs    12/03/21 1445  WBC 6.9  RBC 4.67  HCT 43.0  PLT 266   Recent Labs    12/03/21 1445  NA 134*  K 3.5  CL 105  CO2 24  BUN 11  CREATININE 0.65  GLUCOSE 90  CALCIUM 8.9   Recent Labs    12/03/21 1445  INR 1.0    Neurologically intact ABD soft Neurovascular intact Sensation intact distally Intact pulses distally Dorsiflexion/Plantar flexion intact Incision: dressing C/D/I No cellulitis present Compartment soft   Assessment/Plan: 1 Day Post-Op Procedure(s) (LRB): ANTERIOR CERVICAL DISCECTOMY/DECOMPRESSION AND FUSION CERVICAL THREE THROUGH FIVE (N/A) Advance diet Up with therapy Aspen when OOB Encourage IS DVT Ppx: Teds, SCDs, ambulation Rx: for diflucan given for ppx for yeast infection at patients request. This was sent electronically through [facility].   Plan D/C today.      [name] 12/06/2021, 8:16 AM

## 2021-12-06 NOTE — Discharge Summary (Signed)
Patient ID: Erica Clark MRN: [id number] DOB/AGE: 1976/03/27 45 y.o.  Admit date: 12/05/2021 Discharge date: 12/06/2021  Admission Diagnoses:  Principal Problem:   Cervical disc herniation   Discharge Diagnoses:  Principal Problem:   Cervical disc herniation  status post Procedure(s): ANTERIOR CERVICAL DISCECTOMY/DECOMPRESSION AND FUSION CERVICAL THREE THROUGH FIVE  Past Medical History:  Diagnosis Date   Asthma    H. pylori infection 2005   PDA (patent ductus arteriosus) 1985   left ventrical repair    Surgeries: Procedure(s): ANTERIOR CERVICAL DISCECTOMY/DECOMPRESSION AND FUSION CERVICAL THREE THROUGH FIVE on 12/05/2021   Consultants:   Discharged Condition: Improved  Hospital Course: Erica Clark is an 45 y.o. female who was admitted 12/05/2021 for operative treatment of Cervical disc herniation. Patient failed conservative treatments (please see the history and physical for the specifics) and had severe unremitting pain that affects sleep, daily activities and work/hobbies. After pre-op clearance, the patient was taken to the operating room on 12/05/2021 and underwent  Procedure(s): ANTERIOR CERVICAL DISCECTOMY/DECOMPRESSION AND FUSION CERVICAL THREE THROUGH FIVE.    Patient was given perioperative antibiotics:  Anti-infectives (From admission, onward)    Start     Dose/Rate Route Frequency Ordered Stop   12/05/21 2030  ceFAZolin (ANCEF) IVPB 1 g/50 mL premix        1 g 100 mL/hr over 30 Minutes Intravenous Every 8 hours 12/05/21 1942 12/06/21 0359   12/05/21 1131  ceFAZolin (ANCEF) 2-4 GM/100ML-% IVPB       Note to Pharmacy: Pauletta Browns L: cabinet override      12/05/21 1131 12/05/21 1542   12/05/21 1124  ceFAZolin (ANCEF) IVPB 2g/100 mL premix        2 g 200 mL/hr over 30 Minutes Intravenous 30 min pre-op 12/05/21 1124 12/05/21 1558        Patient was given sequential compression devices and early ambulation to prevent DVT.   Patient  benefited maximally from hospital stay and there were no complications. At the time of discharge, the patient was urinating/moving their bowels without difficulty, tolerating a regular diet, pain is controlled with oral pain medications and they have been cleared by PT/OT.   Recent vital signs: Patient Vitals for the past 24 hrs:  BP Temp Temp src Pulse Resp SpO2  12/06/21 0821 133/80 98.1 F (36.7 C) Oral 83 18 97 %  12/06/21 0336 140/78 98.7 F (37.1 C) Oral (!) 106 18 94 %  12/05/21 2313 (!) 143/86 97.9 F (36.6 C) Oral 93 18 97 %  12/05/21 1947 (!) 147/77 98.6 F (37 C) Oral 89 18 96 %  12/05/21 1915 (!) 144/93 97.7 F (36.5 C) -- 96 13 93 %  12/05/21 1900 (!) 146/83 -- -- 98 20 97 %  12/05/21 1845 (!) 144/85 -- -- (!) 103 15 94 %  12/05/21 1830 (!) 156/75 97.6 F (36.4 C) -- (!) 101 14 94 %     Recent laboratory studies:  Recent Labs    12/03/21 1445  WBC 6.9  HGB 14.1  HCT 43.0  PLT 266  NA 134*  K 3.5  CL 105  CO2 24  BUN 11  CREATININE 0.65  GLUCOSE 90  INR 1.0  CALCIUM 8.9     Discharge Medications:   Allergies as of 12/06/2021   No Known Allergies      Medication List     STOP taking these medications    cyclobenzaprine 10 MG tablet Commonly known as: FLEXERIL   meloxicam 15 MG tablet Commonly  known as: MOBIC   vitamin C 500 MG tablet Commonly known as: ASCORBIC ACID   zinc gluconate 50 MG tablet       TAKE these medications    DULoxetine 30 MG capsule Commonly known as: CYMBALTA Take 30 mg by mouth daily.   magnesium oxide 400 MG tablet Commonly known as: MAG-OX Take 400 mg by mouth daily.   methocarbamol 500 MG tablet Commonly known as: Robaxin Take 1 tablet (500 mg total) by mouth every 8 (eight) hours as needed for up to 5 days for muscle spasms.   ondansetron 4 MG tablet Commonly known as: Zofran Take 1 tablet (4 mg total) by mouth every 8 (eight) hours as needed for nausea or vomiting.   oxyCODONE-acetaminophen 10-325  MG tablet Commonly known as: Percocet Take 1 tablet by mouth every 6 (six) hours as needed for up to 5 days for pain.   SUMAtriptan 100 MG tablet Commonly known as: IMITREX Take 100 mg by mouth every 2 (two) hours as needed for migraine. May repeat in 2 hours if headache persists or recurs.        Diagnostic Studies: DG Cervical Spine 2 or 3 views  Result Date: 12/05/2021 CLINICAL DATA:  Anterior cervical discectomy and fusion EXAM: CERVICAL SPINE - 2-3 VIEW COMPARISON:  03/21/2020 MRI FLUOROSCOPY TIME:  Radiation Exposure Index (as provided by the fluoroscopic device): 7.16 mGy If the device does not provide the exposure index: Fluoroscopy Time:  1 minute 5 seconds Number of Acquired Images:  3 FINDINGS: Initial image demonstrates a surgical instrument along the anterior aspect of the C4-5 interspace. Subsequent interbody fusion at C3-4 and C4-5 are seen. IMPRESSION: Cervical fusion at C3-4 and C4-5. Electronically Signed   By: Alcide Clever M.D.   On: 12/05/2021 18:19   DG C-Arm 1-60 Min-No Report  Result Date: 12/05/2021 Fluoroscopy was utilized by the requesting physician.  No radiographic interpretation.   DG C-Arm 1-60 Min-No Report  Result Date: 12/05/2021 Fluoroscopy was utilized by the requesting physician.  No radiographic interpretation.   DG C-Arm 1-60 Min-No Report  Result Date: 12/05/2021 Fluoroscopy was utilized by the requesting physician.  No radiographic interpretation.    Discharge Instructions     Incentive spirometry RT   Complete by: As directed         Follow-up Information     Venita Lick, MD. Schedule an appointment as soon as possible for a visit in 2 week(s).   Specialty: Orthopedic Surgery Why: If symptoms worsen, For suture removal, For wound re-check Contact information: [address] [place] [address] [phone]                 Discharge Plan:  discharge to home  Disposition:  stable    Signed: [name] for [facility] PA-C [facility] [phone] 12/06/2021, 12:10 PM

## 2021-12-06 NOTE — Evaluation (Signed)
Physical Therapy Evaluation and Discharge Patient Details Name: Erica Clark MRN: [id number] DOB: October 31, 1976 Today's Date: 12/06/2021  History of Present Illness  Pt is a 45 y/o female who presents s/p C3-C5 ACDF on 12/05/2021. PMH significant for patent ductus arteriosus s/p L ventrical repair, vein ligation.   Clinical Impression  Patient evaluated by Physical Therapy with no further acute PT needs identified. All education has been completed and the patient has no further questions. Pt was able to demonstrate transfers and ambulation with gross modified independence and no AD. Pt was educated on precautions, brace application/wearing schedule, appropriate activity progression, and car transfer. See below for any follow-up Physical Therapy or equipment needs. PT is signing off. Thank you for this referral.        Recommendations for follow up therapy are one component of a multi-disciplinary discharge planning process, led by the attending physician.  Recommendations may be updated based on patient status, additional functional criteria and insurance authorization.  Follow Up Recommendations No PT follow up    Assistance Recommended at Discharge PRN  Functional Status Assessment Patient has had a recent decline in their functional status and demonstrates the ability to make significant improvements in function in a reasonable and predictable amount of time.  Equipment Recommendations  None recommended by PT    Recommendations for Other Services       Precautions / Restrictions Precautions Precautions: Cervical;Fall Precaution Booklet Issued: Yes (comment) Precaution Comments: Reviewed handout and pt was cued for precautions during functional mobility. Required Braces or Orthoses: Cervical Brace Cervical Brace: Hard collar;For comfort Restrictions Weight Bearing Restrictions: No      Mobility  Bed Mobility Overal bed mobility: Modified Independent             General  bed mobility comments: HOB elevated. Pt planning to sleep in the recliner first few days.    Transfers Overall transfer level: Modified independent Equipment used: None               General transfer comment: Increased time but no assist required. Pt reports mild dizziness when turning to prepare for stand>sit at end of session.    Ambulation/Gait Ambulation/Gait assistance: Modified independent (Device/Increase time) Gait Distance (Feet): 300 Feet Assistive device: None Gait Pattern/deviations: Step-through pattern;Decreased stride length;Trunk flexed Gait velocity: Decreased Gait velocity interpretation: <1.31 ft/sec, indicative of household ambulator   General Gait Details: Slow and guarded with occasional high guard position of UE's, however no assist required. No overt LOB noted.  Stairs Stairs: Yes Stairs assistance: Min guard Stair Management: One rail Right;Step to pattern;Forwards Number of Stairs: 3 General stair comments: VC's for sequencing and general safety. No assist required but light guard provided for safety.  Wheelchair Mobility    Modified Rankin (Stroke Patients Only)       Balance Overall balance assessment: No apparent balance deficits (not formally assessed)                                           Pertinent Vitals/Pain Pain Assessment: 0-10 Pain Score: 5  Pain Location: neck/shoulders Pain Descriptors / Indicators: Operative site guarding;Sore Pain Intervention(s): Limited activity within patient's tolerance;Monitored during session;Repositioned    Home Living Family/patient expects to be discharged to:: Private residence Living Arrangements: Spouse/significant other;Children Available Help at Discharge: Family;Available 24 hours/day Type of Home: House Home Access: Stairs to enter Entrance Stairs-Rails: Right;Left;Can reach both  Entrance Stairs-Number of Steps: 3   Home Layout: One level Home Equipment: Shower  seat      Prior Function Prior Level of Function : Independent/Modified Independent;Working/employed                     Hand Dominance        Extremity/Trunk Assessment   Upper Extremity Assessment Upper Extremity Assessment: Overall WFL for tasks assessed    Lower Extremity Assessment Lower Extremity Assessment: Overall WFL for tasks assessed    Cervical / Trunk Assessment Cervical / Trunk Assessment: Neck Surgery  Communication   Communication: No difficulties  Cognition Arousal/Alertness: Awake/alert Behavior During Therapy: WFL for tasks assessed/performed Overall Cognitive Status: Within Functional Limits for tasks assessed                                          General Comments      Exercises     Assessment/Plan    PT Assessment Patient does not need any further PT services  PT Problem List         PT Treatment Interventions      PT Goals (Current goals can be found in the Care Plan section)  Acute Rehab PT Goals Patient Stated Goal: Be able to go into the office next week to pack her things and print some documents PT Goal Formulation: All assessment and education complete, DC therapy    Frequency     Barriers to discharge        Co-evaluation               AM-PAC PT "6 Clicks" Mobility  Outcome Measure Help needed turning from your back to your side while in a flat bed without using bedrails?: None Help needed moving from lying on your back to sitting on the side of a flat bed without using bedrails?: None Help needed moving to and from a bed to a chair (including a wheelchair)?: None Help needed standing up from a chair using your arms (e.g., wheelchair or bedside chair)?: None Help needed to walk in hospital room?: None Help needed climbing 3-5 steps with a railing? : A Little 6 Click Score: 23    End of Session Equipment Utilized During Treatment: Gait belt Activity Tolerance: Patient tolerated  treatment well Patient left: in bed;with call bell/phone within reach;with family/visitor present Nurse Communication: Mobility status PT Visit Diagnosis: Unsteadiness on feet (R26.81);Pain Pain - part of body:  (neck)    Time: 5003-7048 PT Time Calculation (min) (ACUTE ONLY): 26 min   Charges:   PT Evaluation $PT Eval Low Complexity: 1 Low PT Treatments $Gait Training: 8-22 mins        Conni Slipper, PT, DPT Acute Rehabilitation Services Pager: [phone] Office: [phone]   Marylynn Pearson 12/06/2021, 10:08 AM

## 2021-12-06 NOTE — Progress Notes (Signed)
Patient awaiting transport via wheelchair by volunteer for discharge home; in no acute distress nor complaints of pain nor discomfort; incision on her neck with honeycomb dressing and is clean, dry and intact with Aspen collar on; room was checked and accounted for all her belongings; discharge instructions concerning her medications, incision care, follow up appointment and when to call the doctor as needed were all discussed with patient and her whusband by RN and they both verbalized understanding on the instructions given.         Note Details

## 2021-12-09 ENCOUNTER — Encounter (HOSPITAL_COMMUNITY): Payer: Self-pay | Admitting: Orthopedic Surgery

## 2022-04-06 IMAGING — RF DG CERVICAL SPINE 2 OR 3 VIEWS
1 series · 3 of 3 positions shown · non-contrast
Comparison: 03/21/2020 MRI

CLINICAL DATA: Anterior cervical discectomy and fusion

EXAM:
CERVICAL SPINE - 2-3 VIEW

[Series 1: run · 3 of 3 slices shown]
[im 1/3]
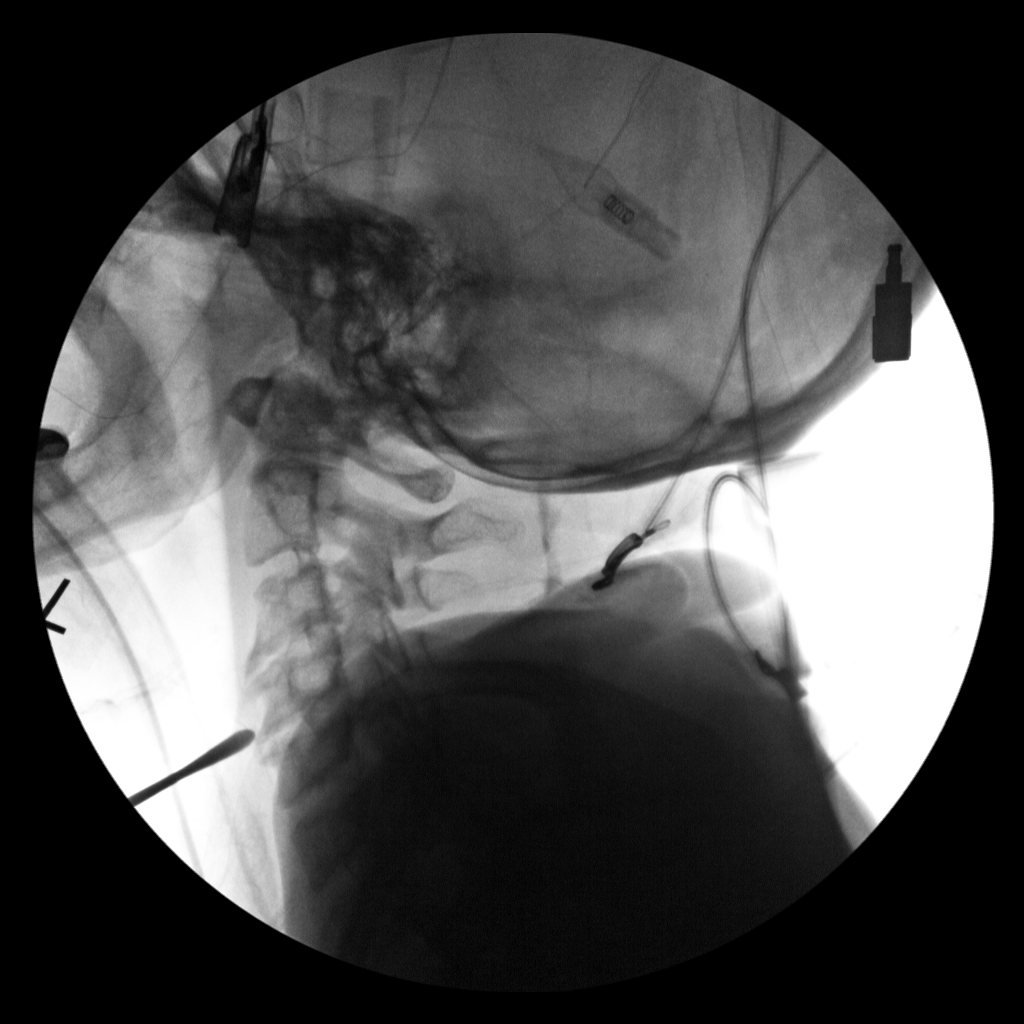
[im 2/3]
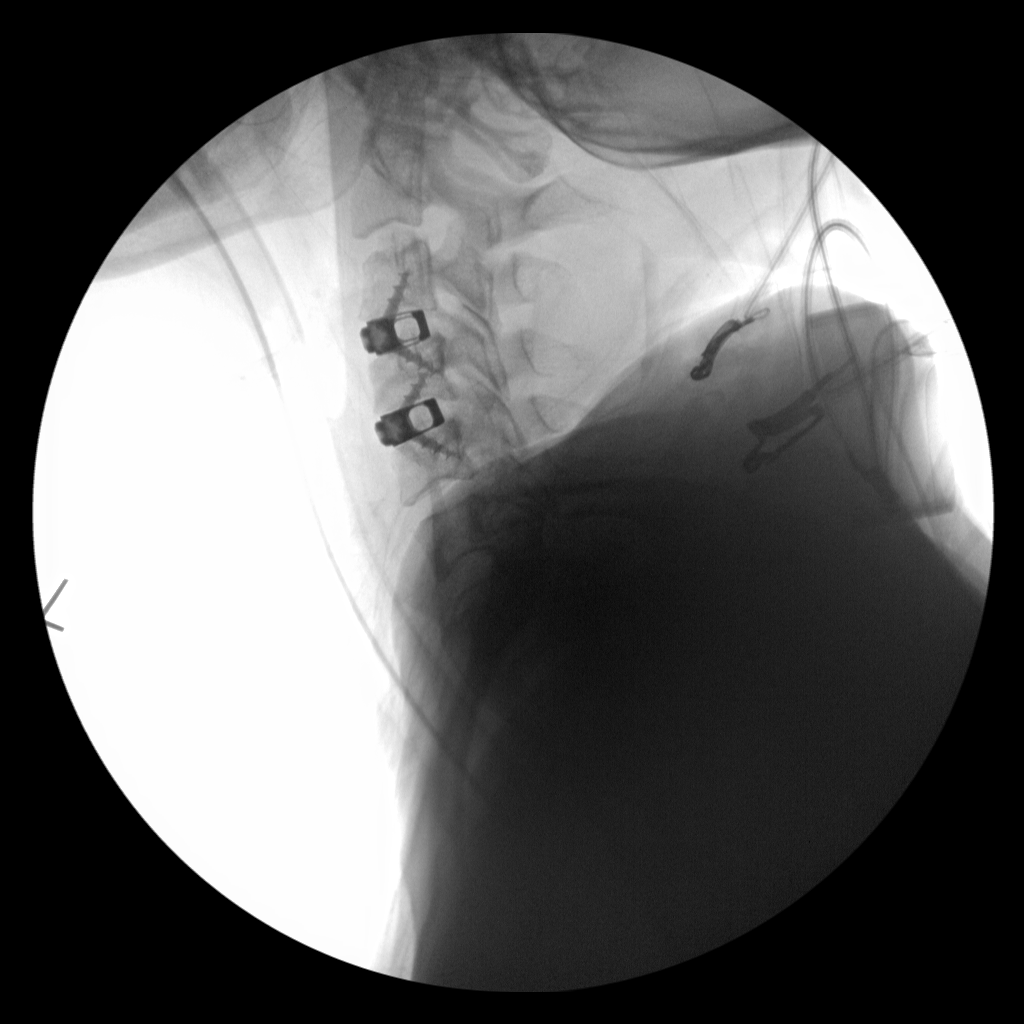
[im 3/3]
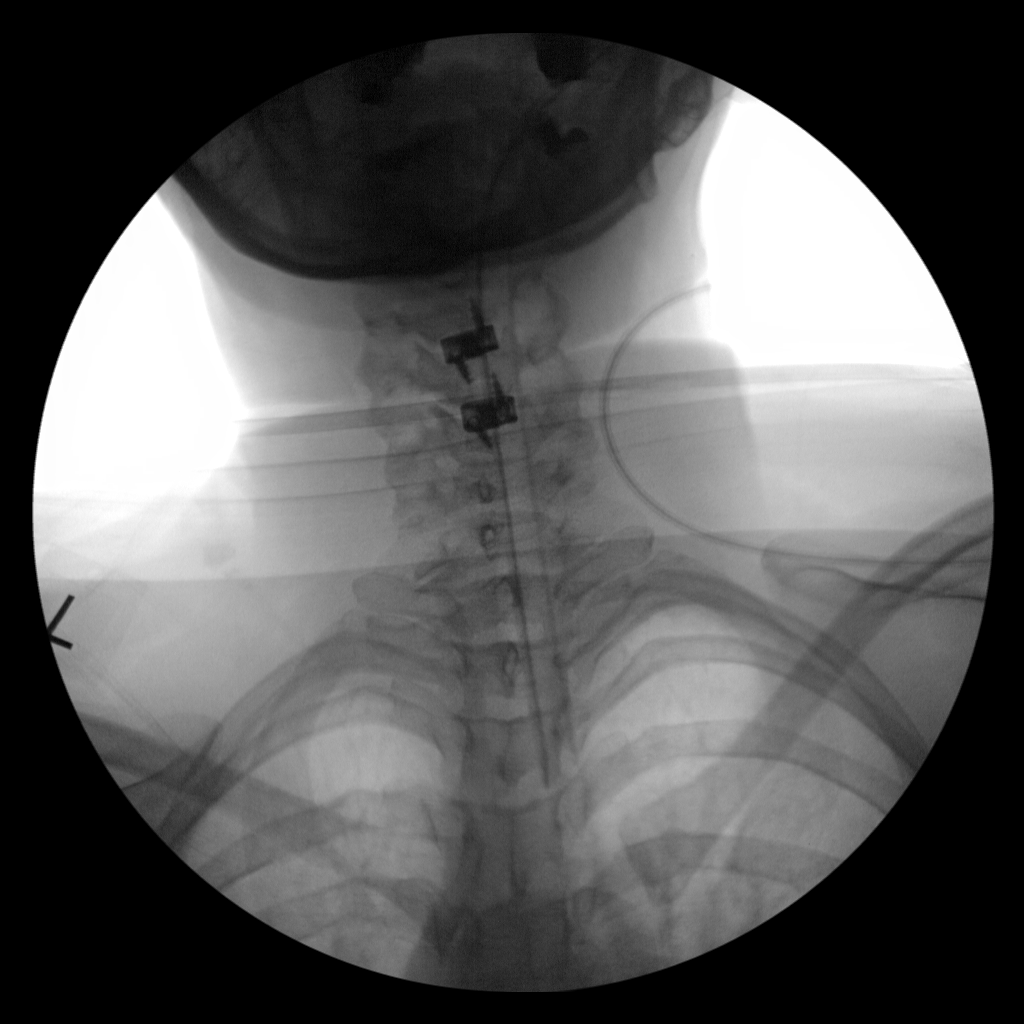

[3 of 3 positions shown; findings below may reference images not displayed]

FLUOROSCOPY TIME:  Radiation Exposure Index (as provided by the
fluoroscopic device): 7.16 mGy

If the device does not provide the exposure index:

Fluoroscopy Time:  1 minute 5 seconds

Number of Acquired Images:  3
FINDINGS: Initial image demonstrates a surgical instrument along the anterior
aspect of the C4-5 interspace. Subsequent interbody fusion at C3-4
and C4-5 are seen.
IMPRESSION: Cervical fusion at C3-4 and C4-5.

## 2022-06-09 ENCOUNTER — Other Ambulatory Visit: Payer: Self-pay | Admitting: Orthopedic Surgery

## 2022-06-09 DIAGNOSIS — M259 Joint disorder, unspecified: Secondary | ICD-10-CM

## 2022-07-08 ENCOUNTER — Ambulatory Visit
Admission: RE | Admit: 2022-07-08 | Discharge: 2022-07-08 | Disposition: A | Discharge status: Home / Self Care | Payer: BC Managed Care – PPO | Source: Ambulatory Visit | Attending: Orthopedic Surgery | Admitting: Orthopedic Surgery

## 2022-07-08 DIAGNOSIS — M259 Joint disorder, unspecified: Secondary | ICD-10-CM

## 2022-10-07 ENCOUNTER — Encounter: Payer: Self-pay | Admitting: Internal Medicine

## 2022-10-07 ENCOUNTER — Ambulatory Visit (INDEPENDENT_AMBULATORY_CARE_PROVIDER_SITE_OTHER): Payer: BC Managed Care – PPO | Admitting: Internal Medicine

## 2022-10-07 VITALS — BP 116/76 | HR 76 | Temp 97.7°F | Resp 16 | Ht 67.5 in | Wt 151.9 lb

## 2022-10-07 DIAGNOSIS — T7800XA Anaphylactic reaction due to unspecified food, initial encounter: Secondary | ICD-10-CM

## 2022-10-07 DIAGNOSIS — J3089 Other allergic rhinitis: Secondary | ICD-10-CM | POA: Diagnosis not present

## 2022-10-07 DIAGNOSIS — K9049 Malabsorption due to intolerance, not elsewhere classified: Secondary | ICD-10-CM

## 2022-10-07 DIAGNOSIS — J302 Other seasonal allergic rhinitis: Secondary | ICD-10-CM

## 2022-10-07 MED ORDER — AZELASTINE HCL 0.1 % NA SOLN: 2.0000 | Freq: Two times a day (BID) | NASAL | 12 refills | Status: DC

## 2022-10-07 MED ORDER — IPRATROPIUM BROMIDE 0.06 % NA SOLN: 2.0000 | Freq: Four times a day (QID) | NASAL | 12 refills | Status: DC

## 2022-10-07 MED ORDER — MONTELUKAST SODIUM 10 MG PO TABS: 10.0000 mg | ORAL_TABLET | Freq: Every day | ORAL | 1 refills | Status: AC

## 2022-10-07 MED ORDER — EPINEPHRINE 0.3 MG/0.3ML IJ SOAJ: 0.3000 mg | INTRAMUSCULAR | 1 refills | Status: AC | PRN

## 2022-10-07 NOTE — Progress Notes (Signed)
New Patient Note  RE: Erica Clark MRN: [id number] DOB: October 15, 1976 Date of Office Visit: 10/07/2022  Consult requested by: No ref. provider found Primary care provider: System, Provider Not In  Chief Complaint: Nasal Congestion (Sneezing and runny nose)  History of Present Illness: I had the pleasure of seeing Erica Clark for initial evaluation at the [facility] on 10/07/2022. She is a 46 y.o. female, who is referred here by System, Provider Not In for the evaluation of possible food allergies and chronic rhinitis .  History obtained from patient, chart review.  Concern for Food Allergy:  Food of concern: soy, gluten and dairy  History of reaction: arthralgias and abdominal pain within 30 minutes after eating  She also has had 35 lbs weight loss since March  Previous allergy testing no Eats egg, fish, shellfish, peanuts, tree nuts, sesame without reactions Carries an epinephrine autoinjector: no Has food allergy action plan no  Chronic rhinitis: started Spring 2023  Symptoms include: nasal congestion, rhinorrhea, post nasal drainage, and sneezing  Occurs year-round Potential triggers: soy, possibly cats  Treatments tried: claritin, cetirizine, allegra, prednisone (some benefit but will stop working), afrin, flonase Previous allergy testing: no History of reflux/heartburn: no History of chronic sinusitis or sinus surgery: no Nonallergic triggers:  strong odors will bother her nose       Assessment and Plan: Erica Clark is a 46 y.o. female with: Seasonal and perennial allergic rhinitis - Plan: Allergy Test  Allergy with anaphylaxis due to food - Plan: Allergy Test  Food intolerance - Plan: Allergy Test Plan: Patient Instructions  Allergic  Rhinitis not well controlled : - allergy testing today was grass pollen, weed pollen, tree pollen, mold, dust mite, tobacco leaf - allergen avoidance as below - consider allergy shots as long term control of  your symptoms by teaching your immune system to be more tolerant of your allergy triggers - Start Nasal Steroid Spray: Options include Flonase (fluticasone), Nasocort (triamcinolone), Nasonex (mometasome) 1- 2 sprays in each nostril daily (can buy over-the-counter if not covered by insurance)  Best results if used daily. - Start Astelin (Azelastine) 1-2 sprays in each nostril twice a day as needed.  You may use this as needed for nasal congestion/itchy ears/itchy nose if desired - Start Atrovent (Ipratropium Bromide) 1-2 sprays in each nostril up to 3 times a day as needed for runny nose/post nasal drip/drainage.  Use less frequently if airway gets too dry. - Start Singulair (Montelukast) 10mg  nightly. - Continue over the counter antihistamine daily or daily as needed.   -Your options include Zyrtec (Cetirizine) 10mg , Claritin (Loratadine) 10mg , Allegra (Fexofenadine) 180mg , or Xyzal (Levocetirinze) 5mg    Food allergy:  - today's skin testing was mildly positive to soy, sesame, casein, cashew - please strictly avoid soy -Try taking cashews, milk, sesame individually at your diet for 2 weeks if symptoms improve keep it out of your diet and strictly avoid.  If symptoms do not then reintroduce as there can be false positive food testing - for SKIN only reaction, okay to take Benadryl 1 capsules every 6 hours - for SKIN + ANY additional symptoms, OR IF concern for LIFE THREATENING reaction = Epipen Autoinjector EpiPen 0.3 mg. - If using Epinephrine autoinjector, call 911 - A food allergy action plan has been provided and discussed. - Medic Alert identification is recommended.  Follow up: 6 months   Thank you so much for letting me partake in your care today.  Don't hesitate to reach  out if you have any additional concerns!  Ferol LuzEvelyn Prosper Paff, MD  [facility] [place] [place]  Reducing Pollen Exposure  The American Academy of Allergy, Asthma and Immunology suggests the following  steps to reduce your exposure to pollen during allergy seasons.    Do not hang sheets or clothing out to dry; pollen may collect on these items. Do not mow lawns or spend time around freshly cut grass; mowing stirs up pollen. Keep windows closed at night.  Keep car windows closed while driving. Minimize morning activities outdoors, a time when pollen Clark are usually at their highest. Stay indoors as much as possible when pollen Clark or humidity is high and on windy days when pollen tends to remain in the air longer. Use air conditioning when possible.  Many air conditioners have filters that trap the pollen spores. Use a HEPA room air filter to remove pollen form the indoor air you breathe.  DUST MITE AVOIDANCE MEASURES:  There are three main measures that need and can be taken to avoid house dust mites:  Reduce accumulation of dust in general -reduce furniture, clothing, carpeting, books, stuffed animals, especially in bedroom  Separate yourself from the dust -use pillow and mattress encasements (can be found at stores such as [employer], [employer] or online) -avoid direct exposure to air condition flow -use a HEPA filter device, especially in the bedroom; you can also use a HEPA filter vacuum cleaner -wipe dust with a moist towel instead of a dry towel or broom when cleaning  Decrease mites and/or their secretions -wash clothing and linen and stuffed animals at highest temperature possible, at least every 2 weeks -stuffed animals can also be placed in a bag and put in a freezer overnight  Despite the above measures, it is impossible to eliminate dust mites or their allergen completely from your home.  With the above measures the burden of mites in your home can be diminished, with the goal of minimizing your allergic symptoms.  Success will be reached only when implementing and using all means together.  Control of Mold Allergen   Mold and fungi can grow on a variety of surfaces  provided certain temperature and moisture conditions exist.  Outdoor molds grow on plants, decaying vegetation and soil.  The major outdoor mold, Alternaria and Cladosporium, are found in very high numbers during hot and dry conditions.  Generally, a late Summer - Fall peak is seen for common outdoor fungal spores.  Rain will temporarily lower outdoor mold spore count, but Clark rise rapidly when the rainy period ends.  The most important indoor molds are Aspergillus and Penicillium.  Dark, humid and poorly ventilated basements are ideal sites for mold growth.  The next most common sites of mold growth are the bathroom and the kitchen.  Outdoor (Seasonal) Mold Control  Positive outdoor molds via skin testing: Alternaria and Epicoccum  Use air conditioning and keep windows closed Avoid exposure to decaying vegetation. Avoid leaf raking. Avoid grain handling. Consider wearing a face mask if working in moldy areas.    Indoor (Perennial) Mold Control   Positive indoor molds via skin testing: Botrytis  Maintain humidity below 50%. Clean washable surfaces with 5% bleach solution. Remove sources e.g. contaminated carpets.     Meds ordered this encounter  Medications   EPINEPHrine 0.3 mg/0.3 mL IJ SOAJ injection    Sig: Inject 0.3 mg into the muscle as needed for anaphylaxis.    Dispense:  1 each  Refill:  1   ipratropium (ATROVENT) 0.06 % nasal spray    Sig: Place 2 sprays into both nostrils 4 (four) times daily.    Dispense:  15 mL    Refill:  12   azelastine (ASTELIN) 0.1 % nasal spray    Sig: Place 2 sprays into both nostrils 2 (two) times daily. Use in each nostril as directed    Dispense:  30 mL    Refill:  12   montelukast (SINGULAIR) 10 MG tablet    Sig: Take 1 tablet (10 mg total) by mouth at bedtime.    Dispense:  90 tablet    Refill:  1   Lab Orders  No laboratory test(s) ordered today    Other allergy screening: Asthma:  childhood asthma which she grew out of   Rhino conjunctivitis: yes Food allergy:  maybe  Medication allergy: no Hymenoptera allergy: no Urticaria: no Eczema:no History of recurrent infections suggestive of immunodeficency: no  Diagnostics: Skin Testing: Environmental allergy panel and select foods. today's skin testing was mildly positive to soy, sesame, casein, cashew Today's skin test was positive to grass pollen, weed pollen, tree pollen, mold, dust mite, tobacco leaf Results interpreted by myself and discussed with patient/family.  Airborne Adult Perc - 10/07/22 1057     Time Antigen Placed 1055    Allergen Manufacturer Waynette Buttery    Location Back    Number of Test 59    1. Control-Buffer 50% Glycerol Negative    2. Control-Histamine 1 mg/ml 4+    3. Albumin saline Negative    4. Bahia 3+    5. French Southern Territories Negative    6. Johnson Negative    7. Kentucky Blue Negative    8. Meadow Fescue Negative    9. Perennial Rye Negative    10. Sweet Vernal 3+    11. Timothy Negative    12. Cocklebur Negative    13. Burweed Marshelder Negative    14. Ragweed, short Negative    15. Ragweed, Giant Negative    16. Plantain,  English Negative    17. Lamb's Quarters Negative    18. Sheep Sorrell Negative    19. Rough Pigweed Negative    20. Marsh Elder, Rough 4+    21. Mugwort, Common Negative    22. Ash mix 4+    23. Birch mix 4+    24. Beech American Negative    25. Box, Elder Negative    26. Cedar, red Negative    27. Cottonwood, Eastern 4+    28. Elm mix 4+    29. Hickory Negative    30. Maple mix Negative    31. Oak, Guinea-Bissau mix Negative    32. Pecan Pollen Negative    33. Pine mix Negative    34. Sycamore Eastern Negative    35. Walnut, Black Pollen Negative    36. Alternaria alternata 3+    37. Cladosporium Herbarum Negative    38. Aspergillus mix Negative    39. Penicillium mix Negative    40. Bipolaris sorokiniana (Helminthosporium) Negative    41. Drechslera spicifera (Curvularia) Negative    42. Mucor  plumbeus Negative    43. Fusarium moniliforme Negative    44. Aureobasidium pullulans (pullulara) Negative    45. Rhizopus oryzae Negative    46. Botrytis cinera Negative    47. Epicoccum nigrum 4+    48. Phoma betae Negative    49. Candida Albicans Negative    50. Trichophyton mentagrophytes Negative  51. Mite, D Farinae  5,000 AU/ml 4+    52. Mite, D Pteronyssinus  5,000 AU/ml 4+    53. Cat Hair 10,000 BAU/ml Negative    54.  Dog Epithelia Negative    55. Mixed Feathers Negative    56. Horse Epithelia Negative    57. Cockroach, German Negative    58. Mouse Negative    59. Tobacco Leaf 4+             Food Adult Perc - 10/07/22 1000     Time Antigen Placed 1055    Allergen Manufacturer Waynette Buttery    Location Back    Number of allergen test 11    1. Peanut Negative    2. Soybean 3+    3. Wheat Negative    4. Sesame 3+    5. Milk, cow Negative    6. Egg White, Chicken Negative    7. Casein 3+    8. Shellfish Mix Negative    9. Fish Mix Negative    10. Cashew 3+    67. 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 pylori infection 12/16/2003   PDA (patent ductus arteriosus) 12/16/1983   left ventrical repair   Past Surgical History: Past Surgical History:  Procedure Laterality Date   ABDOMINAL HYSTERECTOMY  2013   Right ovary still there but every thing else was taken out   ANTERIOR CERVICAL DECOMP/DISCECTOMY FUSION N/A 12/05/2021   Procedure: ANTERIOR CERVICAL DISCECTOMY/DECOMPRESSION AND FUSION CERVICAL THREE THROUGH FIVE;  Surgeon: Venita Lick, MD;  Location: [facility] OR;  Service: Orthopedics;  Laterality: N/A;  4 hrs 3 C-Bed   VEIN LIGATION     Medication List:  Current Outpatient Medications  Medication Sig Dispense Refill   ascorbic acid (VITAMIN C) 100 MG tablet Take 1  tablet by mouth daily.     azelastine (ASTELIN) 0.1 % nasal spray Place 2 sprays into both nostrils 2 (two) times daily. Use in each nostril as directed 30 mL 12   Cholecalciferol (VITAMIN D3) 10 MCG (400 UNIT) CAPS Take by mouth.     EPINEPHrine 0.3 mg/0.3 mL IJ SOAJ injection Inject 0.3 mg into the muscle as needed for anaphylaxis. 1 each 1   ipratropium (ATROVENT) 0.06 % nasal spray Place 2 sprays into both nostrils 4 (four) times daily. 15 mL 12   montelukast (SINGULAIR) 10 MG tablet Take 1 tablet (10 mg total) by mouth at bedtime. 90 tablet 1   Quercetin 250 MG TABS Take by mouth.     Zinc Sulfate (ZINC 15 PO) Take 1 tablet by mouth daily.     No current facility-administered medications for this visit.   Allergies: No Known Allergies Social History: Social History   Socioeconomic History   Marital status: Married    Spouse name: Not on file   Number of children: Not on file   Years of education: Not on file   Highest education level: Not on file  Occupational History   Not on file  Tobacco Use   Smoking status: Former    Types: Cigarettes    Quit date: 12/20/2014    Years since quitting: 7.8    Passive exposure: Never   Smokeless tobacco: Never  Vaping  Use   Vaping Use: Never used  Substance and Sexual Activity   Alcohol use: Yes    Comment: occ   Drug use: No   Sexual activity: Not on file  Other Topics Concern   Not on file  Social History Narrative   Not on file   Social Determinants of Health   Financial Resource Strain: Not on file  Food Insecurity: Not on file  Transportation Needs: Not on file  Physical Activity: Not on file  Stress: Not on file  Social Connections: Not on file   Lives in a single-family home.  There are no roaches in the house and bed is not 2 feet off the floor.  She does have dust mite precautions on better pillows.  She status post to fumes, chemicals or dust.  There is a HEPA filter in the home and [place] an [place]. 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   Objective: BP 116/76   Pulse 76   Temp 97.7 F (36.5 C) (Temporal)   Resp 16   Ht 5' 7.5" (1.715 m)   Wt 151 lb 14.4 oz (68.9 kg)   SpO2 98%   BMI 23.44 kg/m  Body mass index is 23.44 kg/m.  General Appearance:  Alert, cooperative, no distress, appears stated age  Head:  Normocephalic, without obvious abnormality, atraumatic  Eyes:  Conjunctiva clear, EOM's intact  Nose: Nares normal, normal mucosa, no visible anterior polyps, and septum midline  Throat: Lips, tongue normal; teeth and gums normal, normal posterior oropharynx and no tonsillar exudate  Neck: Supple, symmetrical  Lungs:   clear to auscultation bilaterally, Respirations unlabored, no coughing  Heart:  regular rate and rhythm and no murmur, Appears well perfused  Extremities: No edema  Skin: Skin color, texture, turgor normal, no rashes or lesions on visualized portions of skin  Neurologic: No gross deficits   The plan was reviewed with the patient/family, and all questions/concerned were addressed.  It was my pleasure to see Erica Clark today and participate in her care. Please feel free to contact me with any questions or concerns.  Sincerely,  Ferol Luz, MD Allergy & Immunology  [facility] office: [phone] [facility] office: [phone]

## 2022-10-07 NOTE — Patient Instructions (Signed)
Allergic  Rhinitis not well controlled : - allergy testing today was grass pollen, weed pollen, tree pollen, mold, dust mite, tobacco leaf - allergen avoidance as below - consider allergy shots as long term control of your symptoms by teaching your immune system to be more tolerant of your allergy triggers - Start Nasal Steroid Spray: Options include Flonase (fluticasone), Nasocort (triamcinolone), Nasonex (mometasome) 1- 2 sprays in each nostril daily (can buy over-the-counter if not covered by insurance)  Best results if used daily. - Start Astelin (Azelastine) 1-2 sprays in each nostril twice a day as needed.  You may use this as needed for nasal congestion/itchy ears/itchy nose if desired - Start Atrovent (Ipratropium Bromide) 1-2 sprays in each nostril up to 3 times a day as needed for runny nose/post nasal drip/drainage.  Use less frequently if airway gets too dry. - Start Singulair (Montelukast) 10mg  nightly. - Continue over the counter antihistamine daily or daily as needed.   -Your options include Zyrtec (Cetirizine) 10mg , Claritin (Loratadine) 10mg , Allegra (Fexofenadine) 180mg , or Xyzal (Levocetirinze) 5mg    Food allergy:  - today's skin testing was mildly positive to soy, sesame, casein, cashew - please strictly avoid soy -Try taking cashews, milk, sesame individually at your diet for 2 weeks if symptoms improve keep it out of your diet and strictly avoid.  If symptoms do not then reintroduce as there can be false positive food testing - for SKIN only reaction, okay to take Benadryl 1 capsules every 6 hours - for SKIN + ANY additional symptoms, OR IF concern for LIFE THREATENING reaction = Epipen Autoinjector EpiPen 0.3 mg. - If using Epinephrine autoinjector, call 911 - A food allergy action plan has been provided and discussed. - Medic Alert identification is recommended.  Follow up: 6 months   Thank you so much for letting me partake in your care today.  Don't hesitate to reach  out if you have any additional concerns!  , MD  [facility] [place] [place]  Reducing Pollen Exposure  The American Academy of Allergy, Asthma and Immunology suggests the following steps to reduce your exposure to pollen during allergy seasons.    Do not hang sheets or clothing out to dry; pollen may collect on these items. Do not mow lawns or spend time around freshly cut grass; mowing stirs up pollen. Keep windows closed at night.  Keep car windows closed while driving. Minimize morning activities outdoors, a time when pollen counts are usually at their highest. Stay indoors as much as possible when pollen counts or humidity is high and on windy days when pollen tends to remain in the air longer. Use air conditioning when possible.  Many air conditioners have filters that trap the pollen spores. Use a HEPA room air filter to remove pollen form the indoor air you breathe.  DUST MITE AVOIDANCE MEASURES:  There are three main measures that need and can be taken to avoid house dust mites:  Reduce accumulation of dust in general -reduce furniture, clothing, carpeting, books, stuffed animals, especially in bedroom  Separate yourself from the dust -use pillow and mattress encasements (can be found at stores such as Bed, [employer], and Beyond or online) -avoid direct exposure to air condition flow -use a HEPA filter device, especially in the bedroom; you can also use a HEPA filter vacuum cleaner -wipe dust with a moist towel instead of a dry towel or broom when cleaning  Decrease mites and/or their secretions -wash clothing and linen and  stuffed animals at highest temperature possible, at least every 2 weeks -stuffed animals can also be placed in a bag and put in a freezer overnight  Despite the above measures, it is impossible to eliminate dust mites or their allergen completely from your home.  With the above measures the burden of mites in your home can be  diminished, with the goal of minimizing your allergic symptoms.  Success will be reached only when implementing and using all means together.  Control of Mold Allergen   Mold and fungi can grow on a variety of surfaces provided certain temperature and moisture conditions exist.  Outdoor molds grow on plants, decaying vegetation and soil.  The major outdoor mold, Alternaria and Cladosporium, are found in very high numbers during hot and dry conditions.  Generally, a late Summer - Fall peak is seen for common outdoor fungal spores.  Rain will temporarily lower outdoor mold spore count, but counts rise rapidly when the rainy period ends.  The most important indoor molds are Aspergillus and Penicillium.  Dark, humid and poorly ventilated basements are ideal sites for mold growth.  The next most common sites of mold growth are the bathroom and the kitchen.  Outdoor (Seasonal) Mold Control  Positive outdoor molds via skin testing: Alternaria and Epicoccum  Use air conditioning and keep windows closed Avoid exposure to decaying vegetation. Avoid leaf raking. Avoid grain handling. Consider wearing a face mask if working in moldy areas.    Indoor (Perennial) Mold Control   Positive indoor molds via skin testing: Botrytis  Maintain humidity below 50%. Clean washable surfaces with 5% bleach solution. Remove sources e.g. contaminated carpets.

## 2022-10-14 ENCOUNTER — Telehealth: Payer: Self-pay | Admitting: Internal Medicine

## 2022-10-14 NOTE — Telephone Encounter (Signed)
Pt states she did not get a refill for flonase but got the other two nasal sprays and she would like a prescription for an allergy medicine instead of OTC so that Insurance will pay for it.

## 2022-10-15 MED ORDER — IPRATROPIUM BROMIDE 0.06 % NA SOLN: 2.0000 | Freq: Four times a day (QID) | NASAL | 4 refills | Status: AC

## 2022-10-15 MED ORDER — AZELASTINE HCL 0.1 % NA SOLN: 2.0000 | Freq: Two times a day (BID) | NASAL | 4 refills | Status: AC

## 2022-10-15 MED ORDER — FLUTICASONE PROPIONATE 50 MCG/ACT NA SUSP: NASAL | 4 refills | Status: AC

## 2022-10-15 NOTE — Telephone Encounter (Signed)
Called and left message for patient to give our office a call back to see which antihistamine she wanted called in. Sent in fluticasone, azelastine and ipratropium nasal sprays x 1 with 3 refills to [facility].

## 2023-04-13 ENCOUNTER — Ambulatory Visit: Payer: BC Managed Care – PPO | Admitting: Internal Medicine

## 2023-04-13 DIAGNOSIS — J309 Allergic rhinitis, unspecified: Secondary | ICD-10-CM
